# Patient Record
Sex: Female | Born: 1965 | Race: Black or African American | Hispanic: No | Marital: Married | Smoking: Never smoker
Health system: Southern US, Community
[De-identification: ages and names within clinical notes are randomized; demographics above are authoritative.]

## PROBLEM LIST (undated history)

## (undated) DIAGNOSIS — I10 Essential (primary) hypertension: Secondary | ICD-10-CM

## (undated) DIAGNOSIS — M109 Gout, unspecified: Secondary | ICD-10-CM

## (undated) DIAGNOSIS — E119 Type 2 diabetes mellitus without complications: Secondary | ICD-10-CM

## (undated) DIAGNOSIS — T7840XA Allergy, unspecified, initial encounter: Secondary | ICD-10-CM

## (undated) DIAGNOSIS — E669 Obesity, unspecified: Secondary | ICD-10-CM

## (undated) HISTORY — DX: Type 2 diabetes mellitus without complications: E11.9

## (undated) HISTORY — DX: Obesity, unspecified: E66.9

## (undated) HISTORY — PX: TUBAL LIGATION: SHX77

## (undated) HISTORY — DX: Allergy, unspecified, initial encounter: T78.40XA

## (undated) HISTORY — DX: Gout, unspecified: M10.9

---

## 2001-07-12 ENCOUNTER — Other Ambulatory Visit: Admission: RE | Admit: 2001-07-12 | Discharge: 2001-07-12 | Payer: Self-pay | Admitting: Obstetrics and Gynecology

## 2001-08-30 ENCOUNTER — Encounter: Payer: Self-pay | Admitting: Obstetrics and Gynecology

## 2001-08-30 ENCOUNTER — Ambulatory Visit (HOSPITAL_COMMUNITY): Admission: RE | Admit: 2001-08-30 | Discharge: 2001-08-30 | Payer: Self-pay | Admitting: Obstetrics and Gynecology

## 2001-09-08 ENCOUNTER — Ambulatory Visit (HOSPITAL_COMMUNITY): Admission: RE | Admit: 2001-09-08 | Discharge: 2001-09-08 | Payer: Self-pay | Admitting: Obstetrics and Gynecology

## 2001-09-08 ENCOUNTER — Encounter: Payer: Self-pay | Admitting: Obstetrics and Gynecology

## 2005-01-20 ENCOUNTER — Inpatient Hospital Stay (HOSPITAL_COMMUNITY): Admission: AD | Admit: 2005-01-20 | Discharge: 2005-01-20 | Payer: Self-pay | Admitting: Obstetrics and Gynecology

## 2005-01-23 ENCOUNTER — Inpatient Hospital Stay (HOSPITAL_COMMUNITY): Admission: AD | Admit: 2005-01-23 | Discharge: 2005-01-23 | Payer: Self-pay | Admitting: Obstetrics and Gynecology

## 2005-01-26 ENCOUNTER — Inpatient Hospital Stay (HOSPITAL_COMMUNITY): Admission: AD | Admit: 2005-01-26 | Discharge: 2005-01-26 | Payer: Self-pay | Admitting: Obstetrics and Gynecology

## 2005-02-02 ENCOUNTER — Inpatient Hospital Stay (HOSPITAL_COMMUNITY): Admission: AD | Admit: 2005-02-02 | Discharge: 2005-02-02 | Payer: Self-pay | Admitting: Obstetrics and Gynecology

## 2005-02-04 ENCOUNTER — Inpatient Hospital Stay (HOSPITAL_COMMUNITY): Admission: AD | Admit: 2005-02-04 | Discharge: 2005-02-06 | Payer: Self-pay | Admitting: Obstetrics and Gynecology

## 2005-03-17 ENCOUNTER — Other Ambulatory Visit: Admission: RE | Admit: 2005-03-17 | Discharge: 2005-03-17 | Payer: Self-pay | Admitting: Obstetrics and Gynecology

## 2005-07-05 ENCOUNTER — Ambulatory Visit (HOSPITAL_COMMUNITY): Admission: RE | Admit: 2005-07-05 | Discharge: 2005-07-05 | Payer: Self-pay | Admitting: Obstetrics and Gynecology

## 2006-01-11 HISTORY — PX: TUBAL LIGATION: SHX77

## 2012-09-08 ENCOUNTER — Telehealth: Payer: Self-pay | Admitting: *Deleted

## 2012-09-08 ENCOUNTER — Ambulatory Visit (INDEPENDENT_AMBULATORY_CARE_PROVIDER_SITE_OTHER): Payer: BC Managed Care – PPO | Admitting: Emergency Medicine

## 2012-09-08 VITALS — BP 154/110 | HR 110 | Temp 99.2°F | Resp 17 | Ht 60.5 in | Wt 274.0 lb

## 2012-09-08 DIAGNOSIS — I10 Essential (primary) hypertension: Secondary | ICD-10-CM

## 2012-09-08 DIAGNOSIS — G4710 Hypersomnia, unspecified: Secondary | ICD-10-CM

## 2012-09-08 DIAGNOSIS — R0683 Snoring: Secondary | ICD-10-CM

## 2012-09-08 DIAGNOSIS — R040 Epistaxis: Secondary | ICD-10-CM

## 2012-09-08 DIAGNOSIS — E669 Obesity, unspecified: Secondary | ICD-10-CM

## 2012-09-08 DIAGNOSIS — Z1231 Encounter for screening mammogram for malignant neoplasm of breast: Secondary | ICD-10-CM

## 2012-09-08 DIAGNOSIS — G4733 Obstructive sleep apnea (adult) (pediatric): Secondary | ICD-10-CM

## 2012-09-08 LAB — POCT UA - MICROSCOPIC ONLY
Casts, Ur, LPF, POC: NEGATIVE
Mucus, UA: NEGATIVE
WBC, Ur, HPF, POC: NEGATIVE
Yeast, UA: NEGATIVE

## 2012-09-08 LAB — COMPREHENSIVE METABOLIC PANEL
ALT: 17 U/L (ref 0–35)
AST: 18 U/L (ref 0–37)
Albumin: 4 g/dL (ref 3.5–5.2)
Alkaline Phosphatase: 86 U/L (ref 39–117)
Calcium: 9.8 mg/dL (ref 8.4–10.5)
Chloride: 102 mEq/L (ref 96–112)
Potassium: 4.1 mEq/L (ref 3.5–5.3)

## 2012-09-08 LAB — POCT CBC
Granulocyte percent: 56.4 %G (ref 37–80)
HCT, POC: 39.1 % (ref 37.7–47.9)
Hemoglobin: 12 g/dL — AB (ref 12.2–16.2)
Lymph, poc: 2.5 (ref 0.6–3.4)
MCH, POC: 26.4 pg — AB (ref 27–31.2)
MCHC: 30.7 g/dL — AB (ref 31.8–35.4)
MCV: 86.2 fL (ref 80–97)
MID (cbc): 0.4 (ref 0–0.9)
MPV: 8.8 fL (ref 0–99.8)
POC Granulocyte: 3.7 (ref 2–6.9)
POC LYMPH PERCENT: 17.2 %L (ref 10–50)
POC MID %: 6.4 %M (ref 0–12)
Platelet Count, POC: 180 10*3/uL (ref 142–424)
RBC: 4.54 M/uL (ref 4.04–5.48)
RDW, POC: 16.6 %
WBC: 6.6 10*3/uL (ref 4.6–10.2)

## 2012-09-08 LAB — POCT URINALYSIS DIPSTICK
Bilirubin, UA: NEGATIVE
Glucose, UA: NEGATIVE
Leukocytes, UA: NEGATIVE
Nitrite, UA: NEGATIVE
Urobilinogen, UA: 0.2

## 2012-09-08 LAB — TSH: TSH: 1.229 u[IU]/mL (ref 0.350–4.500)

## 2012-09-08 LAB — LIPID PANEL
HDL: 47 mg/dL (ref >39)
LDL Cholesterol: 177 mg/dL — ABNORMAL HIGH (ref 0–99)
Total CHOL/HDL Ratio: 5 Ratio

## 2012-09-08 MED ORDER — LISINOPRIL 20 MG PO TABS: 20.0000 mg | ORAL_TABLET | Freq: Every day | ORAL | Status: DC

## 2012-09-08 NOTE — Progress Notes (Signed)
[facility] [address], [place] [address] [phone]  Date:  09/08/2012   Name:  Victoria Hansen   DOB:  1965-05-13   MRN:  [id number]  PCP:  Abbe Amsterdam, MD    Chief Complaint: Epistaxis   History of Present Illness:  Victoria Hansen is a 47 y.o. very pleasant female patient who presents with the following:  Spontaneous nose bleeds twice in past two days.  Stopped in less than 10 minutes.  No history of injury or illness, wiping or blowing nose.  No medical care in years.  No bleeding or bruising tendency.  No improvement with over the counter medications or other home remedies. Denies other complaint or health concern today.    There are no active problems to display for this patient.   No past medical history on file.  No past surgical history on file.  History  Substance Use Topics  . Smoking status: Never Smoker   . Smokeless tobacco: Not on file  . Alcohol Use: Not on file    No family history on file.  No Known Allergies  Medication list has been reviewed and updated.  No current outpatient prescriptions on file prior to visit.   No current facility-administered medications on file prior to visit.    Review of Systems:  As per HPI, otherwise negative.    Physical Examination: Filed Vitals:   09/08/12 0817  BP: 154/110  Pulse: 110  Temp: 99.2 F (37.3 C)  Resp: 17   Filed Vitals:   09/08/12 0817  Height: 5' 0.5" (1.537 m)  Weight: 274 lb (124.286 kg)   Body mass index is 52.61 kg/(m^2). Ideal Body Weight: Weight in (lb) to have BMI = 25: 129.9  GEN: morbid obesity, NAD, Non-toxic, A & O x 3 HEENT: Atraumatic, Normocephalic. Neck supple. No masses, No LAD. Ears and Nose: No external deformity.  No evidence nosebleed. CV: RRR, No M/G/R. No JVD. No thrill. No extra heart sounds. PULM: CTA B, no wheezes, crackles, rhonchi. No retractions. No resp. distress. No accessory muscle use. ABD: S, NT, ND, +BS. No  rebound. No HSM. EXTR: No c/c/e NEURO Normal gait.  PSYCH: Normally interactive. Conversant. Not depressed or anxious appearing.  Calm demeanor.    Assessment and Plan: OSA Hypertension Sleep study Lisinopril Follow up in two weeks  To 104  Signed,  Phillips Odor, MD

## 2012-09-08 NOTE — Addendum Note (Signed)
Addended by: Cydney Ok on: 09/08/2012 11:26 AM   Modules accepted: Orders

## 2012-09-08 NOTE — Telephone Encounter (Signed)
refers patient for attended sleep study. Patient was referred by Phillips Odor, MD@ [facility]: Patient was seen for nosebleed on 8-29. Dr. Dareen Piano would like a sleep study and manage CPAP if indicated   Height:5' 0.5" (1.537 m)  Weight:274 lb (124.286 kg)  BMI:52.61 kg/m2   Past Medical History:Essential hypertension, benign, Morbid obesity, Obstructive sleep apnea.   Sleep Symptoms: Patient was called and she indicated that she has habitual snoring and excessive daytime sleepiness.  Epworth Score:11  Medications:lisinopril (PRINIVIL,ZESTRIL) 20 MG tablet 90 tablet 0 09/08/2012 Take 1 tablet (20 mg total) by mouth daily.   Insurance:BCBS/patient has a $70.00 co-pay and coverage is 100%  Please review patient information and submit instructions for scheduling and orders for sleep technologist.  Thank you!      I called and left a message for the patient to callback to the office to schedule her sleep study.

## 2012-09-08 NOTE — Patient Instructions (Addendum)

## 2012-09-09 ENCOUNTER — Encounter (HOSPITAL_COMMUNITY): Payer: Self-pay

## 2012-09-09 ENCOUNTER — Emergency Department (HOSPITAL_COMMUNITY)
Admission: EM | Admit: 2012-09-09 | Discharge: 2012-09-09 | Disposition: A | Discharge status: Home / Self Care | Payer: BC Managed Care – PPO | Attending: Emergency Medicine | Admitting: Emergency Medicine

## 2012-09-09 DIAGNOSIS — Z79899 Other long term (current) drug therapy: Secondary | ICD-10-CM | POA: Insufficient documentation

## 2012-09-09 DIAGNOSIS — R040 Epistaxis: Secondary | ICD-10-CM | POA: Insufficient documentation

## 2012-09-09 DIAGNOSIS — I10 Essential (primary) hypertension: Secondary | ICD-10-CM | POA: Insufficient documentation

## 2012-09-09 HISTORY — DX: Essential (primary) hypertension: I10

## 2012-09-09 MED ORDER — PHENYLEPHRINE HCL 0.5 % NA SOLN
1.0000 [drp] | Freq: Once | NASAL | Status: AC
  Administered 2012-09-09: 1 [drp] via NASAL
  Filled 2012-09-09: qty 15

## 2012-09-09 NOTE — ED Notes (Addendum)
Patient presents to ED via POV. Pt states that she had a nose bleed Thursday at work and also on Friday morning. Pt went PCP and was diagnosed with hypertension for the first time. PCP placed pt on blood pressure medication. Pt states that she had another nose bleed tonight, that was much heavier than the other two and was not stopping. Bleeding is very minimal at this time. Pt does not c/o of a headache or any pain. A&Ox4.

## 2012-09-11 NOTE — ED Provider Notes (Signed)
CSN: [id number]     Arrival date & time 09/09/12  0205 History   First MD Initiated Contact with Patient 09/09/12 [phone]     Chief Complaint  Patient presents with  . Epistaxis   (Consider location/radiation/quality/duration/timing/severity/associated sxs/prior Treatment) HPI This patient is a very pleasant woman who presents after a couple of hours of right-sided epistaxis. She says she first had right-sided epistaxis about 3 days ago which was nontraumatic. The episode was short lived but recurred the next day. Today's episode lasted the longest period but, had resolved by the time I saw the patient emergency department.  The patient notes that she has high blood pressure and is concerned that her high blood pressure could have caused epistaxis. Her blood pressure is mildly elevated in the emergency department at 152/93.  The patient denies any lightheadedness or symptoms of anemia. She is not anticoagulated. She has no history of epistaxis.  Past Medical History  Diagnosis Date  . Hypertension    Past Surgical History  Procedure Laterality Date  . Tubal ligation     No family history on file. History  Substance Use Topics  . Smoking status: Never Smoker   . Smokeless tobacco: Never Used  . Alcohol Use: No   OB History   Grav Para Term Preterm Abortions TAB SAB Ect Mult Living                 Review of Systems Gen: no weight loss, fevers, chills, night sweats Eyes: no discharge or drainage, no occular pain or visual changes Nose: As per history of present illness, otherwise negative Mouth: no dental pain, no sore throat Neck: no neck pain Lungs: no SOB, cough, wheezing CV: no chest pain, palpitations, dependent edema or orthopnea Abd: no abdominal pain, nausea, vomiting GU: no dysuria or gross hematuria MSK: no myalgias or arthralgias Neuro: no headache, no focal neurologic deficits Skin: no rash Psyche: negative.  Allergies  Review of patient's allergies indicates  no known allergies.  Home Medications   Current Outpatient Rx  Name  Route  Sig  Dispense  Refill  . lisinopril (PRINIVIL,ZESTRIL) 20 MG tablet   Oral   Take 1 tablet (20 mg total) by mouth daily.   90 tablet   0    BP 160/92  Pulse 90  Temp(Src) 98.1 F (36.7 C) (Oral)  Resp 16  Ht 5\' 1"  (1.549 m)  Wt 174 lb (78.926 kg)  BMI 32.89 kg/m2  SpO2 99%  LMP 07/19/2012 Physical Exam Gen: well developed and well nourished appearing Head: NCAT Eyes: PERL, EOMI Nose: clotted blood in the right nare, no active bleeding. Re-examined after patient expelled clots. There appears to be inflammed mucosa over the region of Hesselbach's plexus,no active bleeding. Unable to rule out posterior bleed. No active bleeding at time of exam.  Mouth/throat: mucosa is moist and pink Neck: supple, no stridor Lungs: CTA B, no wheezing, rhonchi or rales CV: RRR, no murmur Abd: soft, notender, nondistended, obese Back: normal to inspection Skin: normal color, warm Neuro: CN ii-xii grossly intact, no focal deficits Psyche; normal affect,  calm and cooperative.   ED Course  Procedures (including critical care time)  Patient expelled large clot from right nostril and then inhaled some neosynephrine to help with exam. Findings as noted above. Examined with nasal speculum.  MDM  Patient with spontaneous but self limited epistaxis - unable to disceren anterior from posterior bleeding in this case but, suspect anterior. Patient observed for a couple of  hours in the ED with no recurrence. Advised to use Neosynephrine tid x next 3 days and stop. Given referral to local ENT to see if sx recur.   1. Epistaxis   2. Hypertension        Brandt Loosen, MD 09/11/12 [phone]

## 2012-09-18 ENCOUNTER — Other Ambulatory Visit: Payer: Self-pay | Admitting: Neurology

## 2012-09-18 DIAGNOSIS — G4710 Hypersomnia, unspecified: Secondary | ICD-10-CM

## 2012-09-18 DIAGNOSIS — G4730 Sleep apnea, unspecified: Secondary | ICD-10-CM

## 2012-09-18 DIAGNOSIS — E66813 Obesity, class 3: Secondary | ICD-10-CM

## 2012-09-18 DIAGNOSIS — R0683 Snoring: Secondary | ICD-10-CM

## 2012-09-30 ENCOUNTER — Telehealth: Payer: Self-pay

## 2012-09-30 NOTE — Telephone Encounter (Signed)
Patient called wanting to know the location of the office where [facility] has referred her to for a procedure. Phone # [phone].

## 2012-10-01 NOTE — Telephone Encounter (Signed)
LM with address for [facility]-

## 2012-10-02 ENCOUNTER — Ambulatory Visit
Admission: RE | Admit: 2012-10-02 | Discharge: 2012-10-02 | Disposition: A | Discharge status: Home / Self Care | Payer: BC Managed Care – PPO | Source: Ambulatory Visit | Attending: Emergency Medicine | Admitting: Emergency Medicine

## 2012-10-02 DIAGNOSIS — Z1231 Encounter for screening mammogram for malignant neoplasm of breast: Secondary | ICD-10-CM

## 2012-10-09 ENCOUNTER — Telehealth: Payer: Self-pay

## 2012-10-09 NOTE — Telephone Encounter (Signed)
Pt is calling to see who called her earlier  Call back number is [phone]

## 2012-10-09 NOTE — Telephone Encounter (Signed)
Pt was called to tell her that her mammogram was normal. Pt notified.

## 2012-10-10 NOTE — Addendum Note (Signed)
Addended by: Melvyn Novas on: 10/10/2012 01:21 PM   Modules accepted: Orders

## 2012-10-10 NOTE — Sleep Study (Signed)
No sleep study to review

## 2012-11-03 ENCOUNTER — Ambulatory Visit (INDEPENDENT_AMBULATORY_CARE_PROVIDER_SITE_OTHER): Payer: BC Managed Care – PPO

## 2012-11-03 DIAGNOSIS — G4733 Obstructive sleep apnea (adult) (pediatric): Secondary | ICD-10-CM

## 2012-11-03 DIAGNOSIS — R0683 Snoring: Secondary | ICD-10-CM

## 2012-11-03 DIAGNOSIS — G4730 Sleep apnea, unspecified: Secondary | ICD-10-CM

## 2012-11-03 DIAGNOSIS — G4710 Hypersomnia, unspecified: Secondary | ICD-10-CM

## 2012-11-03 DIAGNOSIS — E669 Obesity, unspecified: Secondary | ICD-10-CM

## 2012-11-14 ENCOUNTER — Telehealth: Payer: Self-pay | Admitting: Neurology

## 2012-11-14 ENCOUNTER — Encounter: Payer: Self-pay | Admitting: *Deleted

## 2012-11-14 NOTE — Telephone Encounter (Signed)
I called and spoke with the patient about her recent sleep study. I informed the patient that her sleep study did reveal the diagnosis of severe obstructive sleep apnea and Dr. Vickey Huger would like for her to start CPAP. Patient understood and I will mail a copy to her and fax a copy to Dr. Thornton Papas.

## 2012-11-17 ENCOUNTER — Other Ambulatory Visit: Payer: Self-pay | Admitting: *Deleted

## 2012-11-17 DIAGNOSIS — G4733 Obstructive sleep apnea (adult) (pediatric): Secondary | ICD-10-CM

## 2012-12-08 ENCOUNTER — Ambulatory Visit (INDEPENDENT_AMBULATORY_CARE_PROVIDER_SITE_OTHER): Payer: BC Managed Care – PPO | Admitting: Family Medicine

## 2012-12-08 VITALS — BP 128/72 | HR 80 | Temp 98.5°F | Resp 18 | Ht 60.5 in | Wt 264.6 lb

## 2012-12-08 DIAGNOSIS — I10 Essential (primary) hypertension: Secondary | ICD-10-CM

## 2012-12-08 DIAGNOSIS — Z23 Encounter for immunization: Secondary | ICD-10-CM

## 2012-12-08 MED ORDER — LISINOPRIL 20 MG PO TABS: 20.0000 mg | ORAL_TABLET | Freq: Every day | ORAL | Status: DC

## 2012-12-08 NOTE — Progress Notes (Signed)
[facility] [address] [place] [address] [phone]  Date:  12/08/2012   Name:  Victoria Hansen   DOB:  February 01, 1965   MRN:  [id number]  PCP:  Abbe Amsterdam, MD    Chief Complaint: Medication Management   History of Present Illness:  Victoria Hansen is a 47 y.o. very pleasant female patient who presents with the following:  She is here today to follow-up HTN and lisinopril use.  She notes that her pressure is running around 125/70 at home.  Her nosebleeds have resolved.  She did see ENT for a check and was ok    She has bene on lisinopril for 3 months now and does not note any SI or problems  She is s/p BTL  She is not sure if she would like a flu shot.  She is a Runner, broadcasting/film/video.  We talked about it and she decided she would like this vaccine today  There are no active problems to display for this patient.   Past Medical History  Diagnosis Date  . Hypertension     Past Surgical History  Procedure Laterality Date  . Tubal ligation      History  Substance Use Topics  . Smoking status: Never Smoker   . Smokeless tobacco: Never Used  . Alcohol Use: No    Family History  Problem Relation Age of Onset  . Hypertension Mother   . Thyroid disease Mother   . Heart disease Father   . Hypertension Father     No Known Allergies  Medication list has been reviewed and updated.  Current Outpatient Prescriptions on File Prior to Visit  Medication Sig Dispense Refill  . lisinopril (PRINIVIL,ZESTRIL) 20 MG tablet Take 1 tablet (20 mg total) by mouth daily.  90 tablet  0   No current facility-administered medications on file prior to visit.    Review of Systems:  As per HPI- otherwise negative.   Physical Examination: Filed Vitals:   12/08/12 1630  BP: 128/72  Pulse: 80  Temp: 98.5 F (36.9 C)  Resp: 18   Filed Vitals:   12/08/12 1630  Height: 5' 0.5" (1.537 m)  Weight: 264 lb 9.6 oz (120.022 kg)   Body mass index is 50.81  kg/(m^2). Ideal Body Weight: Weight in (lb) to have BMI = 25: 129.9  GEN: WDWN, NAD, Non-toxic, A & O x 3, obese HEENT: Atraumatic, Normocephalic. Neck supple. No masses, No LAD. Ears and Nose: No external deformity. CV: RRR, No M/G/R. No JVD. No thrill. No extra heart sounds. PULM: CTA B, no wheezes, crackles, rhonchi. No retractions. No resp. distress. No accessory muscle use. EXTR: No c/c/e NEURO Normal gait.  PSYCH: Normally interactive. Conversant. Not depressed or anxious appearing.  Calm demeanor.    Assessment and Plan: HTN (hypertension) - Plan: lisinopril (PRINIVIL,ZESTRIL) 20 MG tablet, Basic metabolic panel  Flu vaccine need - Plan: Flu Vaccine QUAD 36+ mos IM  HTN controlled.  Refill meds, await BMP Flu shot today  Signed Abbe Amsterdam, MD

## 2012-12-09 LAB — BASIC METABOLIC PANEL
CO2: 27 mEq/L (ref 19–32)
Calcium: 9.7 mg/dL (ref 8.4–10.5)
Creat: 0.85 mg/dL (ref 0.50–1.10)
Glucose, Bld: 93 mg/dL (ref 70–99)

## 2012-12-10 ENCOUNTER — Encounter: Payer: Self-pay | Admitting: Family Medicine

## 2013-09-18 ENCOUNTER — Other Ambulatory Visit: Payer: Self-pay | Admitting: Family Medicine

## 2013-09-23 IMAGING — MG MM SCREEN MAMMOGRAM BILATERAL
8 of 12 series · 8 of 12 positions shown · non-contrast
Comparison: None.

CLINICAL DATA: Screening.

DIGITAL SCREENING BILATERAL MAMMOGRAM WITH CAD

[R CC (1 of 3)]
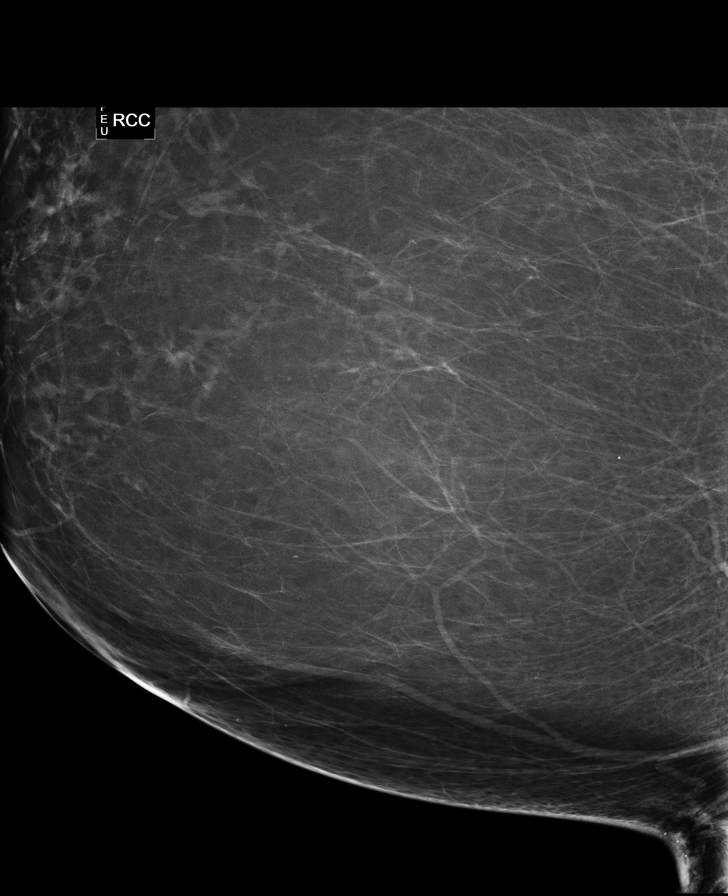

[L CC]
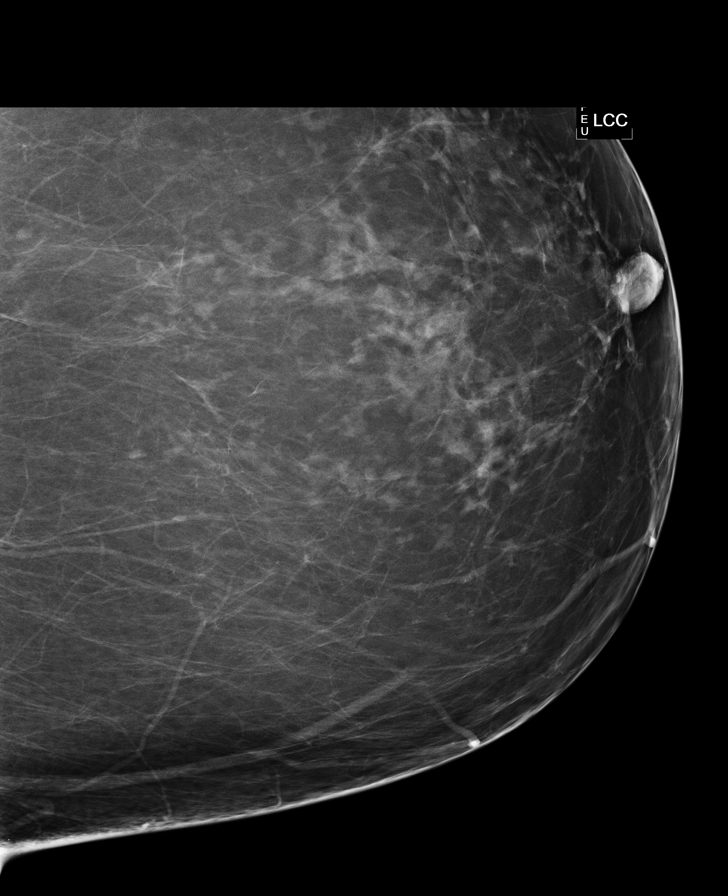

[L MLO]
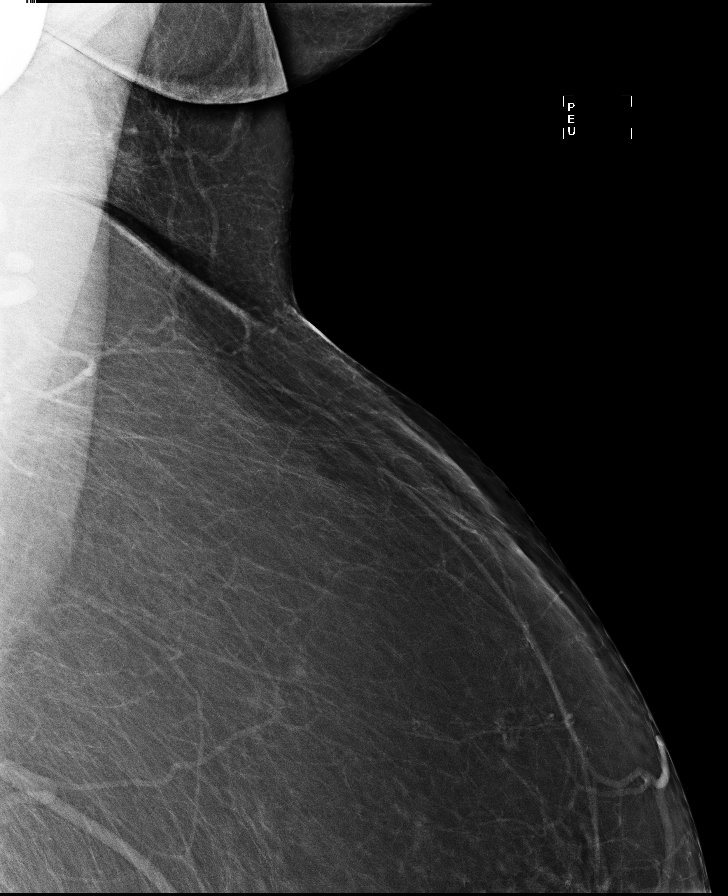

[R MLO (1 of 3)]
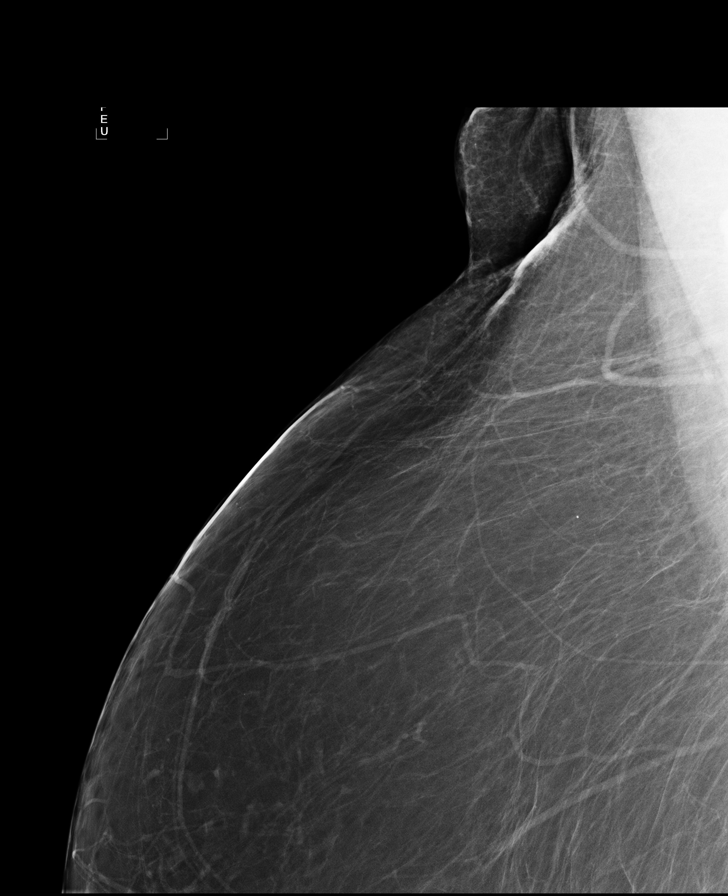

[R CC (2 of 3)]
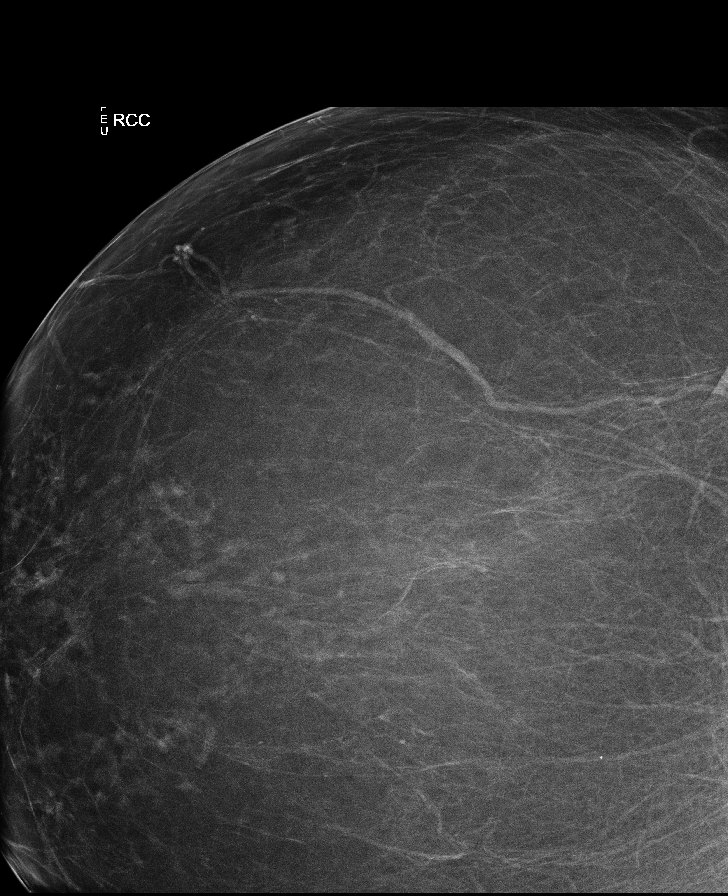

[R CC (3 of 3)]
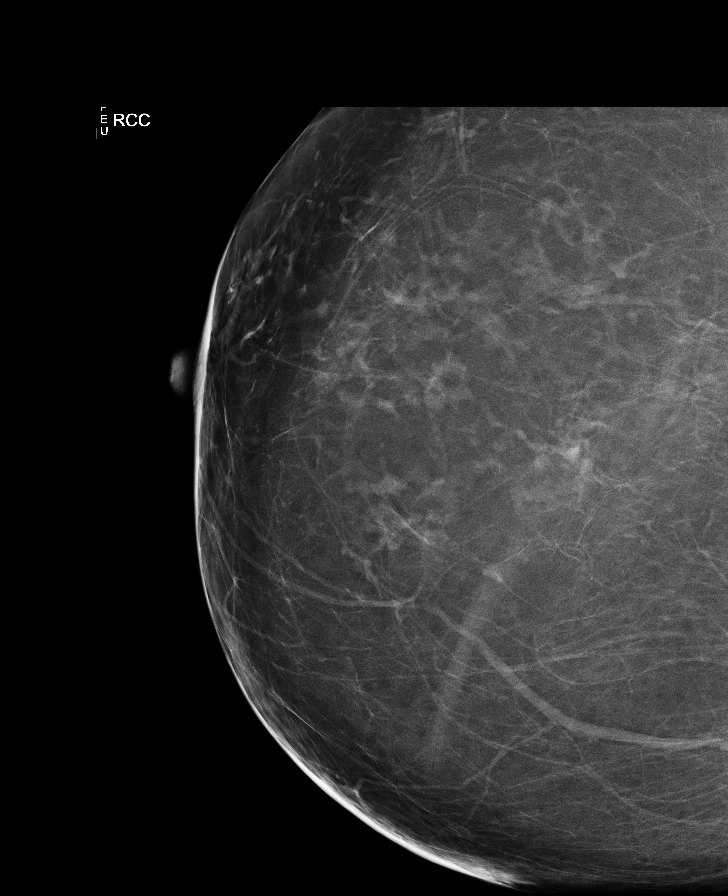

[R MLO (2 of 3)]
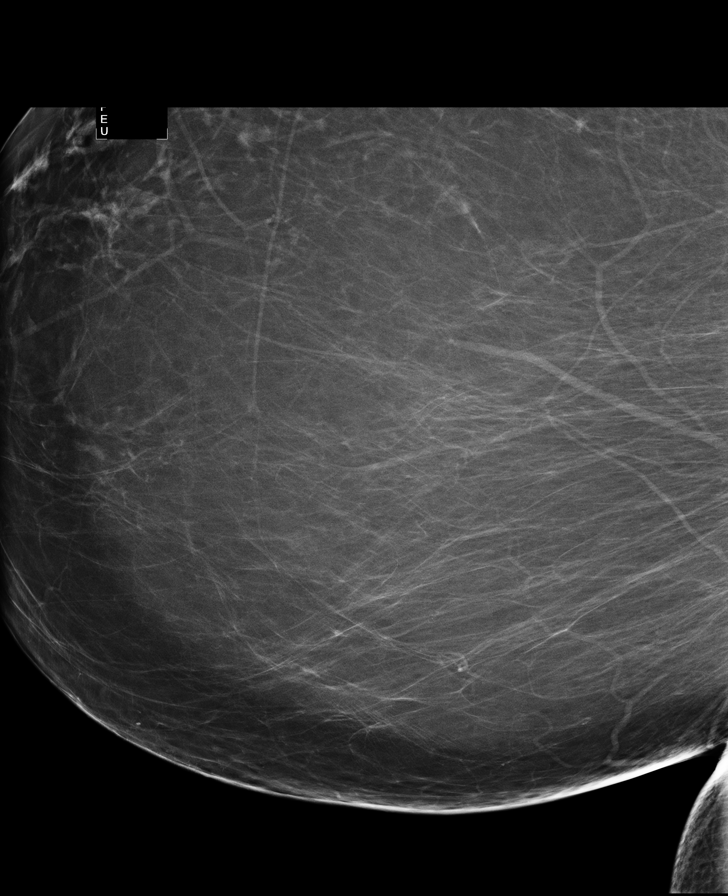

[R MLO (3 of 3)]
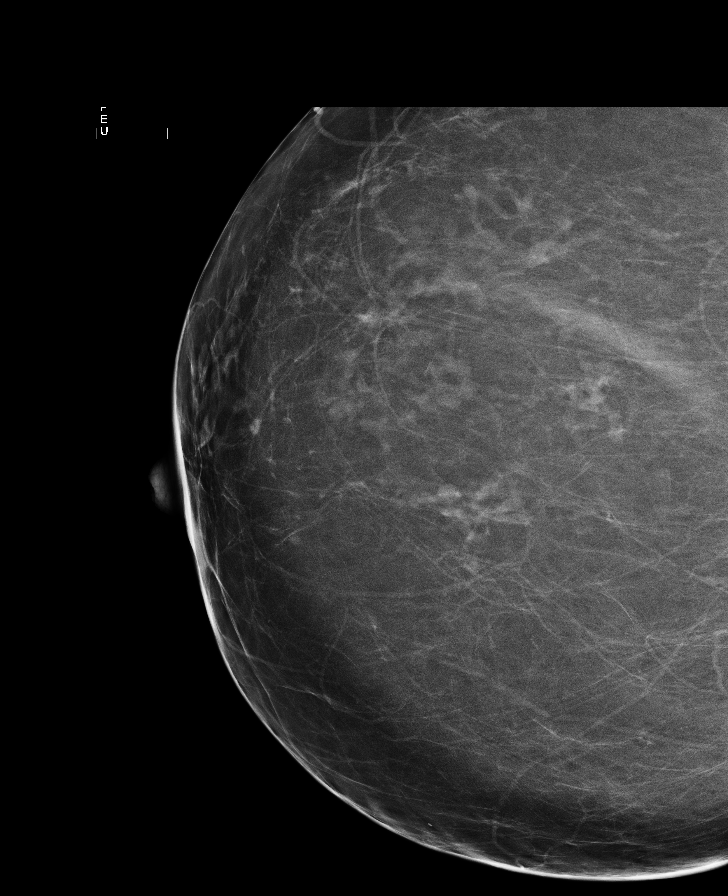

[8 of 12 positions shown; findings below may reference images not displayed]

FINDINGS: ACR Breast Density Category a:  The breast tissue is almost
entirely fatty.

There are no findings suspicious for malignancy.

Images were processed with CAD.
IMPRESSION: No mammographic evidence of malignancy.

A result letter of this screening mammogram will be mailed directly
to the patient.

RECOMMENDATION:
Screening mammogram in one year. (Code:IL-Z-3CG)

BI-RADS CATEGORY 1:  Negative.

## 2013-10-26 ENCOUNTER — Ambulatory Visit (INDEPENDENT_AMBULATORY_CARE_PROVIDER_SITE_OTHER): Payer: BC Managed Care – PPO | Admitting: Family Medicine

## 2013-10-26 VITALS — BP 134/88 | HR 89 | Temp 98.7°F | Resp 18 | Ht 60.0 in | Wt 271.0 lb

## 2013-10-26 DIAGNOSIS — I10 Essential (primary) hypertension: Secondary | ICD-10-CM

## 2013-10-26 DIAGNOSIS — D6489 Other specified anemias: Secondary | ICD-10-CM

## 2013-10-26 DIAGNOSIS — Z23 Encounter for immunization: Secondary | ICD-10-CM

## 2013-10-26 MED ORDER — LISINOPRIL 20 MG PO TABS: 20.0000 mg | ORAL_TABLET | Freq: Every day | ORAL | Status: DC

## 2013-10-26 NOTE — Patient Instructions (Signed)
Your blood pressure looks ok- continue the same dose of medication.  I will be in touch with your labs.    Wt Readings from Last 3 Encounters:  10/26/13 271 lb (122.925 kg)  12/08/12 264 lb 9.6 oz (120.022 kg)  11/04/12 260 lb (117.935 kg)   Your weight has gone up since last year- please try to work on your diet and exercise routines.  Take care!

## 2013-10-26 NOTE — Progress Notes (Signed)
[facility] [address], [place] [address] [phone]  Date:  10/26/2013   Name:  Victoria ReasonMichele B Elmore   DOB:  11/02/1965   MRN:  [id number]  PCP:  Abbe AmsterdamOPLAND,JESSICA, MD    Chief Complaint: Medication Refill   History of Present Illness:  Victoria Hansen is a 48 y.o. very pleasant female patient who presents with the following:  last seen by myself about a year ago.  She needs a RF of her BP medication.  She does occasionally check her BP and may get 130s/80s.   She is doing ok with her medication.  She is s/p BTL She ate about 5.5 hours ago.    She does have an OB-GYN who manages her pap/ mammogram  Patient Active Problem List   Diagnosis Date Noted  . Morbid obesity 12/08/2012  . Essential hypertension, benign 12/08/2012    Past Medical History  Diagnosis Date  . Hypertension     Past Surgical History  Procedure Laterality Date  . Tubal ligation      History  Substance Use Topics  . Smoking status: Never Smoker   . Smokeless tobacco: Never Used  . Alcohol Use: No    Family History  Problem Relation Age of Onset  . Hypertension Mother   . Thyroid disease Mother   . Heart disease Father   . Hypertension Father     No Known Allergies  Medication list has been reviewed and updated.  Current Outpatient Prescriptions on File Prior to Visit  Medication Sig Dispense Refill  . lisinopril (PRINIVIL,ZESTRIL) 20 MG tablet Take 1 tablet (20 mg total) by mouth daily. PATIENT NEEDS OFFICE VISIT FOR ADDITIONAL REFILLS  30 tablet  0   No current facility-administered medications on file prior to visit.    Review of Systems:  As per HPI- otherwise negative.   Physical Examination: Filed Vitals:   10/26/13 1709  BP: 134/88  Pulse: 89  Temp: 98.7 F (37.1 C)  Resp: 18   Filed Vitals:   10/26/13 1709  Height: 5' (1.524 m)  Weight: 271 lb (122.925 kg)   Body mass index is 52.93 kg/(m^2). Ideal Body Weight: Weight in (lb) to  have BMI = 25: 127.7  GEN: WDWN, NAD, Non-toxic, A & O x 3, obese, looks well HEENT: Atraumatic, Normocephalic. Neck supple. No masses, No LAD. Ears and Nose: No external deformity. CV: RRR, No M/G/R. No JVD. No thrill. No extra heart sounds. PULM: CTA B, no wheezes, crackles, rhonchi. No retractions. No resp. distress. No accessory muscle use. EXTR: No c/c/e NEURO Normal gait.  PSYCH: Normally interactive. Conversant. Not depressed or anxious appearing.  Calm demeanor.    Assessment and Plan: Essential hypertension - Plan: lisinopril (PRINIVIL,ZESTRIL) 20 MG tablet, CBC, Comprehensive metabolic panel, Lipid panel  Morbid obesity  Flu shot, labs (she is NOT fasting) and refill of her BP medication today.  See patient instructions for more details.     Signed Abbe AmsterdamJessica Copland, MD

## 2013-10-27 LAB — COMPREHENSIVE METABOLIC PANEL
ALBUMIN: 3.7 g/dL (ref 3.5–5.2)
ALK PHOS: 78 U/L (ref 39–117)
ALT: 16 U/L (ref 0–35)
AST: 17 U/L (ref 0–37)
BUN: 15 mg/dL (ref 6–23)
CO2: 26 mEq/L (ref 19–32)
Calcium: 9.7 mg/dL (ref 8.4–10.5)
Chloride: 104 mEq/L (ref 96–112)
Creat: 0.8 mg/dL (ref 0.50–1.10)
Glucose, Bld: 86 mg/dL (ref 70–99)
Potassium: 4.1 mEq/L (ref 3.5–5.3)
Sodium: 138 mEq/L (ref 135–145)
TOTAL PROTEIN: 7.5 g/dL (ref 6.0–8.3)
Total Bilirubin: 0.3 mg/dL (ref 0.2–1.2)

## 2013-10-27 LAB — LIPID PANEL
Cholesterol: 204 mg/dL — ABNORMAL HIGH (ref 0–200)
HDL: 42 mg/dL (ref >39)
LDL Cholesterol: 146 mg/dL — ABNORMAL HIGH (ref 0–99)
Total CHOL/HDL Ratio: 4.9 Ratio
Triglycerides: 80 mg/dL (ref <150)
VLDL: 16 mg/dL (ref 0–40)

## 2013-10-27 LAB — CBC
HCT: 34.7 % — ABNORMAL LOW (ref 36.0–46.0)
Hemoglobin: 11.2 g/dL — ABNORMAL LOW (ref 12.0–15.0)
MCH: 26.2 pg (ref 26.0–34.0)
MCHC: 32.3 g/dL (ref 30.0–36.0)
MCV: 81.1 fL (ref 78.0–100.0)
PLATELETS: 408 10*3/uL — AB (ref 150–400)
RBC: 4.28 MIL/uL (ref 3.87–5.11)
RDW: 15.7 % — AB (ref 11.5–15.5)
WBC: 6.8 10*3/uL (ref 4.0–10.5)

## 2013-10-28 ENCOUNTER — Encounter: Payer: Self-pay | Admitting: Family Medicine

## 2013-10-28 NOTE — Addendum Note (Signed)
Addended by: Abbe AmsterdamOPLAND, Gaylan Fauver C on: 10/28/2013 04:05 PM   Modules accepted: Orders

## 2014-11-26 ENCOUNTER — Other Ambulatory Visit: Payer: Self-pay | Admitting: Family Medicine

## 2014-12-14 ENCOUNTER — Ambulatory Visit (INDEPENDENT_AMBULATORY_CARE_PROVIDER_SITE_OTHER): Payer: BC Managed Care – PPO | Admitting: Family Medicine

## 2014-12-14 VITALS — BP 138/85 | HR 80 | Temp 98.5°F | Resp 14 | Ht 61.0 in | Wt 275.2 lb

## 2014-12-14 DIAGNOSIS — Z131 Encounter for screening for diabetes mellitus: Secondary | ICD-10-CM | POA: Diagnosis not present

## 2014-12-14 DIAGNOSIS — Z23 Encounter for immunization: Secondary | ICD-10-CM | POA: Diagnosis not present

## 2014-12-14 DIAGNOSIS — I10 Essential (primary) hypertension: Secondary | ICD-10-CM

## 2014-12-14 DIAGNOSIS — Z13220 Encounter for screening for lipoid disorders: Secondary | ICD-10-CM | POA: Diagnosis not present

## 2014-12-14 DIAGNOSIS — Z130 Encounter for screening for diseases of the blood and blood-forming organs and certain disorders involving the immune mechanism: Secondary | ICD-10-CM

## 2014-12-14 DIAGNOSIS — Z794 Long term (current) use of insulin: Secondary | ICD-10-CM | POA: Diagnosis not present

## 2014-12-14 DIAGNOSIS — E119 Type 2 diabetes mellitus without complications: Secondary | ICD-10-CM

## 2014-12-14 LAB — CBC
HCT: 34.6 % — ABNORMAL LOW (ref 36.0–46.0)
Hemoglobin: 11.5 g/dL — ABNORMAL LOW (ref 12.0–15.0)
MCH: 27.2 pg (ref 26.0–34.0)
MCHC: 33.2 g/dL (ref 30.0–36.0)
MCV: 81.8 fL (ref 78.0–100.0)
MPV: 9.2 fL (ref 8.6–12.4)
Platelets: 402 10*3/uL — ABNORMAL HIGH (ref 150–400)
RBC: 4.23 MIL/uL (ref 3.87–5.11)
RDW: 15.4 % (ref 11.5–15.5)
WBC: 6.2 10*3/uL (ref 4.0–10.5)

## 2014-12-14 LAB — LIPID PANEL
CHOL/HDL RATIO: 4.7 ratio (ref <=5.0)
Cholesterol: 217 mg/dL — ABNORMAL HIGH (ref 125–200)
HDL: 46 mg/dL (ref >=46)
LDL CALC: 155 mg/dL — AB (ref <130)
Triglycerides: 80 mg/dL (ref <150)
VLDL: 16 mg/dL (ref <30)

## 2014-12-14 LAB — COMPREHENSIVE METABOLIC PANEL
ALT: 17 U/L (ref 6–29)
AST: 19 U/L (ref 10–35)
Albumin: 4 g/dL (ref 3.6–5.1)
Alkaline Phosphatase: 94 U/L (ref 33–115)
BUN: 12 mg/dL (ref 7–25)
CHLORIDE: 99 mmol/L (ref 98–110)
CO2: 26 mmol/L (ref 20–31)
Calcium: 9.3 mg/dL (ref 8.6–10.2)
Creat: 0.85 mg/dL (ref 0.50–1.10)
GLUCOSE: 96 mg/dL (ref 65–99)
POTASSIUM: 4 mmol/L (ref 3.5–5.3)
Sodium: 133 mmol/L — ABNORMAL LOW (ref 135–146)
Total Bilirubin: 0.5 mg/dL (ref 0.2–1.2)
Total Protein: 7.9 g/dL (ref 6.1–8.1)

## 2014-12-14 MED ORDER — LISINOPRIL 20 MG PO TABS: ORAL_TABLET | ORAL | Status: DC

## 2014-12-14 NOTE — Patient Instructions (Signed)
It was good to see you today- [place]   I will be in touch with your labs You got your tdap (tetanus booster) and flu shots today Keep an eye on your BP- we would like to see you running no higher than 140/85 on average   If you have not had a pap and complete physical in a while I would encourage you to schedule this.

## 2014-12-14 NOTE — Progress Notes (Addendum)
[facility] [address], [place] [address] [phone]  Date:  12/14/2014   Name:  Victoria ReasonMichele B Majchrzak   DOB:  06-18-1965   MRN:  [id number]  PCP:  Abbe AmsterdamOPLAND,Ollie Delano, MD    Chief Complaint: Follow-up; Medication Refill; and Immunizations   History of Present Illness:  Victoria Hansen is a 49 y.o. very pleasant female patient who presents with the following:  She is here today for a recheck of her labs and BP She would like a flu shot and needs a refill of her BP medication She is taking her lisinopril without any problems She is s/p BTL BP Readings from Last 3 Encounters:  12/14/14 142/88  10/26/13 134/88  12/08/12 128/72   She does check her BP sometimes- her last check was 140/74  She is fasting today for labs Unsure of last tetanus shot but would like to go ahead and do one today  Patient Active Problem List   Diagnosis Date Noted  . Morbid obesity (HCC) 12/08/2012  . Essential hypertension, benign 12/08/2012    Past Medical History  Diagnosis Date  . Hypertension     Past Surgical History  Procedure Laterality Date  . Tubal ligation      Social History  Substance Use Topics  . Smoking status: Never Smoker   . Smokeless tobacco: Never Used  . Alcohol Use: No    Family History  Problem Relation Age of Onset  . Hypertension Mother   . Thyroid disease Mother   . Heart disease Father   . Hypertension Father     No Known Allergies  Medication list has been reviewed and updated.  Current Outpatient Prescriptions on File Prior to Visit  Medication Sig Dispense Refill  . lisinopril (PRINIVIL,ZESTRIL) 20 MG tablet TAKE 1 TABLET BY MOUTH DAILY   "OFFICE VISIT NEEDED FOR REFILLS" 30 tablet 0   No current facility-administered medications on file prior to visit.    Review of Systems:  As per HPI- otherwise negative.  Physical Examination: Filed Vitals:   12/14/14 0929  BP: 142/88  Pulse: 80  Temp: 98.5 F (36.9 C)   Resp: 14   Filed Vitals:   12/14/14 0929  Height: 5\' 1"  (1.549 m)  Weight: 275 lb 3.2 oz (124.83 kg)   Body mass index is 52.03 kg/(m^2). Ideal Body Weight: Weight in (lb) to have BMI = 25: 132  GEN: WDWN, NAD, Non-toxic, A & O x 3, obese, looks well HEENT: Atraumatic, Normocephalic. Neck supple. No masses, No LAD. Ears and Nose: No external deformity. CV: RRR, No M/G/R. No JVD. No thrill. No extra heart sounds. PULM: CTA B, no wheezes, crackles, rhonchi. No retractions. No resp. distress. No accessory muscle use. EXTR: No c/c/e NEURO Normal gait.  PSYCH: Normally interactive. Conversant. Not depressed or anxious appearing.  Calm demeanor.    Assessment and Plan: Essential hypertension - Plan: Comprehensive metabolic panel, lisinopril (PRINIVIL,ZESTRIL) 20 MG tablet  Need for immunization against influenza - Plan: Flu Vaccine QUAD 36+ mos IM  Screening for hyperlipidemia - Plan: Lipid panel  Screening for diabetes mellitus - Plan: Hemoglobin A1c  Screening for deficiency anemia - Plan: CBC  Immunization due - Plan: Tdap vaccine greater than or equal to 7yo IM  Check labs and refill mediation today Will plan further follow- up pending labs. Updated immunizations She sees her OBG for paps, etc  Signed Abbe AmsterdamJessica Reynald Woods, MD  [facility].  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  I  am going to send over an rx for metformin- take this once a day for one week, then go to BID.  Let's plan to recheck in about 2 months   Results for orders placed or performed in visit on 12/14/14  CBC  Result Value Ref Range   WBC 6.2 4.0 - 10.5 K/uL   RBC 4.23 3.87 - 5.11 MIL/uL   Hemoglobin 11.5 (L) 12.0 - 15.0 g/dL   HCT 16.1 (L) 09.6 - 04.5 %   MCV 81.8 78.0 - 100.0 fL   MCH 27.2 26.0 - 34.0 pg   MCHC 33.2 30.0 - 36.0 g/dL   RDW 40.9 81.1 - 91.4 %   Platelets 402 (H) 150 - 400 K/uL   MPV 9.2 8.6 - 12.4 fL  Comprehensive metabolic panel  Result Value Ref Range   Sodium 133 (L) 135 - 146 mmol/L    Potassium 4.0 3.5 - 5.3 mmol/L   Chloride 99 98 - 110 mmol/L   CO2 26 20 - 31 mmol/L   Glucose, Bld 96 65 - 99 mg/dL   BUN 12 7 - 25 mg/dL   Creat 7.82 9.56 - 2.13 mg/dL   Total Bilirubin 0.5 0.2 - 1.2 mg/dL   Alkaline Phosphatase 94 33 - 115 U/L   AST 19 10 - 35 U/L   ALT 17 6 - 29 U/L   Total Protein 7.9 6.1 - 8.1 g/dL   Albumin 4.0 3.6 - 5.1 g/dL   Calcium 9.3 8.6 - 08.6 mg/dL  Lipid panel  Result Value Ref Range   Cholesterol 217 (H) 125 - 200 mg/dL   Triglycerides 80 <578 mg/dL   HDL 46 >=46 mg/dL   Total CHOL/HDL Ratio 4.7 <=5.0 Ratio   VLDL 16 <30 mg/dL   LDL Cholesterol 962 (H) <130 mg/dL  Hemoglobin X5M  Result Value Ref Range   Hgb A1c MFr Bld 6.9 (H) <5.7 %   Mean Plasma Glucose 151 (H) <117 mg/dL

## 2014-12-15 ENCOUNTER — Encounter: Payer: Self-pay | Admitting: Family Medicine

## 2014-12-15 DIAGNOSIS — E119 Type 2 diabetes mellitus without complications: Secondary | ICD-10-CM | POA: Insufficient documentation

## 2014-12-15 DIAGNOSIS — E118 Type 2 diabetes mellitus with unspecified complications: Secondary | ICD-10-CM | POA: Insufficient documentation

## 2014-12-15 LAB — HEMOGLOBIN A1C
HEMOGLOBIN A1C: 6.9 % — AB (ref <5.7)
MEAN PLASMA GLUCOSE: 151 mg/dL — AB (ref <117)

## 2014-12-15 MED ORDER — METFORMIN HCL 500 MG PO TABS: 500.0000 mg | ORAL_TABLET | Freq: Two times a day (BID) | ORAL | Status: DC

## 2014-12-15 NOTE — Addendum Note (Signed)
Addended by: Abbe AmsterdamOPLAND, JESSICA C on: 12/15/2014 02:38 PM   Modules accepted: Orders

## 2015-01-31 ENCOUNTER — Encounter: Payer: Self-pay | Admitting: Family Medicine

## 2015-02-05 ENCOUNTER — Encounter: Payer: Self-pay | Admitting: Family Medicine

## 2016-01-08 ENCOUNTER — Other Ambulatory Visit: Payer: Self-pay | Admitting: Emergency Medicine

## 2016-01-08 ENCOUNTER — Other Ambulatory Visit: Payer: Self-pay | Admitting: Family Medicine

## 2016-01-08 DIAGNOSIS — I10 Essential (primary) hypertension: Secondary | ICD-10-CM

## 2016-01-08 MED ORDER — LISINOPRIL 20 MG PO TABS: ORAL_TABLET | ORAL | 0 refills | Status: DC

## 2016-02-25 ENCOUNTER — Encounter: Payer: Self-pay | Admitting: Physician Assistant

## 2016-02-25 ENCOUNTER — Ambulatory Visit (INDEPENDENT_AMBULATORY_CARE_PROVIDER_SITE_OTHER): Payer: BC Managed Care – PPO | Admitting: Physician Assistant

## 2016-02-25 VITALS — BP 126/76 | HR 108 | Temp 99.6°F | Resp 16 | Ht 60.0 in | Wt 273.0 lb

## 2016-02-25 DIAGNOSIS — I10 Essential (primary) hypertension: Secondary | ICD-10-CM

## 2016-02-25 DIAGNOSIS — E119 Type 2 diabetes mellitus without complications: Secondary | ICD-10-CM

## 2016-02-25 DIAGNOSIS — J029 Acute pharyngitis, unspecified: Secondary | ICD-10-CM

## 2016-02-25 DIAGNOSIS — R509 Fever, unspecified: Secondary | ICD-10-CM | POA: Diagnosis not present

## 2016-02-25 DIAGNOSIS — R05 Cough: Secondary | ICD-10-CM

## 2016-02-25 DIAGNOSIS — R059 Cough, unspecified: Secondary | ICD-10-CM

## 2016-02-25 DIAGNOSIS — J020 Streptococcal pharyngitis: Secondary | ICD-10-CM

## 2016-02-25 LAB — POCT INFLUENZA A/B
INFLUENZA A, POC: NEGATIVE
INFLUENZA B, POC: NEGATIVE

## 2016-02-25 LAB — POCT RAPID STREP A (OFFICE): Rapid Strep A Screen: POSITIVE — AB

## 2016-02-25 MED ORDER — BENZONATATE 100 MG PO CAPS: 100.0000 mg | ORAL_CAPSULE | Freq: Three times a day (TID) | ORAL | 0 refills | Status: DC | PRN

## 2016-02-25 MED ORDER — AMOXICILLIN 500 MG PO CAPS: 500.0000 mg | ORAL_CAPSULE | Freq: Two times a day (BID) | ORAL | 0 refills | Status: AC

## 2016-02-25 NOTE — Patient Instructions (Addendum)
Take antibiotics as prescribed for strep. You can use tessalon perles for the cough. And take tylenol for fever. Drink lots of water and eat soup or soft foods until sore throat resolves. Follow up with me next week for annual physical exam and basic lab work! Thank you for letting me participate in your health and well being.    Strep Throat Strep throat is an infection of the throat. It is caused by germs. Strep throat spreads from person to person because of coughing, sneezing, or close contact. Follow these instructions at home: Medicines  Take over-the-counter and prescription medicines only as told by your doctor.  Take your antibiotic medicine as told by your doctor. Do not stop taking the medicine even if you feel better.  Have family members who also have a sore throat or fever go to a doctor. Eating and drinking  Do not share food, drinking cups, or personal items.  Try eating soft foods until your sore throat feels better.  Drink enough fluid to keep your pee (urine) clear or pale yellow. General instructions  Rinse your mouth (gargle) with a salt-water mixture 3-4 times per day or as needed. To make a salt-water mixture, stir -1 tsp of salt into 1 cup of warm water.  Make sure that all people in your house wash their hands well.  Rest.  Stay home from school or work until you have been taking antibiotics for 24 hours.  Keep all follow-up visits as told by your doctor. This is important. Contact a doctor if:  Your neck keeps getting bigger.  You get a rash, cough, or earache.  You cough up thick liquid that is green, yellow-brown, or bloody.  You have pain that does not get better with medicine.  Your problems get worse instead of getting better.  You have a fever. Get help right away if:  You throw up (vomit).  You get a very bad headache.  You neck hurts or it feels stiff.  You have chest pain or you are short of breath.  You have drooling, very  bad throat pain, or changes in your voice.  Your neck is swollen or the skin gets red and tender.  Your mouth is dry or you are peeing less than normal.  You keep feeling more tired or it is hard to wake up.  Your joints are red or they hurt. This information is not intended to replace advice given to you by your health care provider. Make sure you discuss any questions you have with your health care provider. Document Released: 06/16/2007 Document Revised: 08/27/2015 Document Reviewed: 04/22/2014 Elsevier Interactive Patient Education  2017 [employer]     IF you received an x-ray today, you will receive an invoice from [facility]. Please contact [facility] at [phone] with questions or concerns regarding your invoice.   IF you received labwork today, you will receive an invoice from [facility]. Please contact [facility] at [phone] with questions or concerns regarding your invoice.   Our billing staff will not be able to assist you with questions regarding bills from these companies.  You will be contacted with the lab results as soon as they are available. The fastest way to get your results is to activate your My Chart account. Instructions are located on the last page of this paperwork. If you have not heard from us regarding the results in 2 weeks, please contact this office.

## 2016-02-25 NOTE — Progress Notes (Signed)
MRN: [id number] DOB: 05/02/1965  Subjective:   Victoria Hansen ReasonMichele B Hansen is a 51 y.o. female presenting for chief complaint of Fever (X 2 DAYS); Cough; and Sore Throat .  Reports 2 day history of body aches, sore throat, cough, fever (101), rhinorrhea, and mild chills. Has tried tylenol for relief of fever. Denies  ear pain and shortness of breath, nausea, vomiting, abdominal pain and diarrhea. 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 No history of seasonal allergies or asthma. Patient did not have flu shot this season. Denies smoking and alcohol use. Denies any other aggravating or relieving factors, no other questions or concerns.  Victoria Hansen has a current medication list which includes the following prescription(s): lisinopril and metformin. Also has No Known Allergies.  Victoria Hansen  has a past medical history of Hypertension. Also  has a past surgical history that includes Tubal ligation.   Objective:   Vitals: BP 126/76 (BP Location: Right Arm, Patient Position: Sitting, Cuff Size: Large)   Pulse (!) 108   Temp 99.6 F (37.6 C) (Oral)   Resp 16   Ht 5' (1.524 m)   Wt 273 lb (123.8 kg)   LMP 10/25/2015 (Approximate)   SpO2 97%   BMI 53.32 kg/m   Physical Exam  Constitutional: She is oriented to person, place, and time. She appears well-developed and well-nourished. She appears distressed (mild, sitting on exam table).  HENT:  Head: Normocephalic and atraumatic.  Right Ear: External ear and ear canal normal. A middle ear effusion is present.  Left Ear: Tympanic membrane, external ear and ear canal normal.  Nose: Mucosal edema present. No rhinorrhea. Right sinus exhibits no maxillary sinus tenderness and no frontal sinus tenderness. Left sinus exhibits no maxillary sinus tenderness and no frontal sinus tenderness.  Mouth/Throat: Uvula is midline. Posterior oropharyngeal erythema present. Tonsils are 2+ on the right. Tonsils are  2+ on the left. Tonsillar exudate.  Eyes: Conjunctivae are normal.  Neck: Normal range of motion.  Cardiovascular: Normal rate, regular rhythm and normal heart sounds.   Pulmonary/Chest: Effort normal and breath sounds normal.  Lymphadenopathy:       Head (right side): No submental, no submandibular, no tonsillar, no preauricular, no posterior auricular and no occipital adenopathy present.       Head (left side): No submental, no submandibular, no tonsillar, no preauricular, no posterior auricular and no occipital adenopathy present.    She has no cervical adenopathy.       Right: No supraclavicular adenopathy present.       Left: No supraclavicular adenopathy present.  Neurological: She is alert and oriented to person, place, and time.  Skin: Skin is warm and dry.  Psychiatric: She has a normal mood and affect.  Vitals reviewed.   Results for orders placed or performed in visit on 02/25/16 (from the past 24 hour(s))  POCT rapid strep A     Status: Abnormal   Collection Time: 02/25/16  9:19 AM  Result Value Ref Range   Rapid Strep A Screen Positive (A) Negative  POCT Influenza A/B     Status: None   Collection Time: 02/25/16  9:29 AM  Result Value Ref Range   Influenza A, POC Negative Negative   Influenza B, POC Negative Negative    Assessment and Plan :  1. Sore throat - POCT rapid strep A  2. Cough - POCT Influenza A/B - benzonatate (TESSALON) 100 MG capsule; Take  1-2 capsules (100-200 mg total) by mouth 3 (three) times daily as needed for cough.  Dispense: 40 capsule; Refill: 0  3. Fever, unspecified fever cause - POCT Influenza A/B  4. Strep pharyngitis -Given educational material on strep throat. Pt informed to consume soft food diet and continue oral hydration with water. Use tylenol for fever and pain. Return to clinic if symptoms worsen, do not improve with treatment, or as needed - amoxicillin (AMOXIL) 500 MG capsule; Take 1 capsule (500 mg total) by mouth 2 (two)  times daily.  Dispense: 20 capsule; Refill: 0  5. Type 2 diabetes mellitus without complication, without long-term current use of insulin (HCC) 6. Essential hypertension Of note, pt has not had an annual this year and has chronic diseases such as HTN and T2DM that are not being followed. She stopped taking metformin a while ago as it was bothering her stomach and has not had an A1C since she stopped taking the medication. She has been informed to follow up with me in two weeks for annual physical exam and management of chronic disorders.   Benjiman Core, PA-C  [facility] 02/25/2016 9:46 AM

## 2016-03-03 ENCOUNTER — Ambulatory Visit: Payer: BC Managed Care – PPO | Admitting: Physician Assistant

## 2016-03-10 ENCOUNTER — Encounter: Payer: Self-pay | Admitting: Physician Assistant

## 2016-03-10 ENCOUNTER — Ambulatory Visit (INDEPENDENT_AMBULATORY_CARE_PROVIDER_SITE_OTHER): Payer: BC Managed Care – PPO | Admitting: Physician Assistant

## 2016-03-10 VITALS — BP 150/88 | HR 81 | Temp 98.5°F | Resp 18 | Ht 60.0 in | Wt 270.0 lb

## 2016-03-10 DIAGNOSIS — Z130 Encounter for screening for diseases of the blood and blood-forming organs and certain disorders involving the immune mechanism: Secondary | ICD-10-CM | POA: Diagnosis not present

## 2016-03-10 DIAGNOSIS — Z23 Encounter for immunization: Secondary | ICD-10-CM

## 2016-03-10 DIAGNOSIS — I10 Essential (primary) hypertension: Secondary | ICD-10-CM

## 2016-03-10 DIAGNOSIS — Z8349 Family history of other endocrine, nutritional and metabolic diseases: Secondary | ICD-10-CM

## 2016-03-10 DIAGNOSIS — E119 Type 2 diabetes mellitus without complications: Secondary | ICD-10-CM

## 2016-03-10 DIAGNOSIS — Z1321 Encounter for screening for nutritional disorder: Secondary | ICD-10-CM

## 2016-03-10 DIAGNOSIS — Z0000 Encounter for general adult medical examination without abnormal findings: Secondary | ICD-10-CM

## 2016-03-10 LAB — POCT GLYCOSYLATED HEMOGLOBIN (HGB A1C): Hemoglobin A1C: 6.5

## 2016-03-10 MED ORDER — BLOOD GLUCOSE MONITOR SYSTEM W/DEVICE KIT: 1.0000 [IU] | PACK | Freq: Every day | 0 refills | Status: DC

## 2016-03-10 MED ORDER — METFORMIN HCL ER 500 MG PO TB24: ORAL_TABLET | ORAL | 0 refills | Status: DC

## 2016-03-10 MED ORDER — LISINOPRIL 20 MG PO TABS: ORAL_TABLET | ORAL | 1 refills | Status: DC

## 2016-03-10 NOTE — Progress Notes (Signed)
Victoria Hansen  MRN: [id number] DOB: Jul 31, 1965  Subjective:  Pt is a 51 y.o. female who presents for annual physical exam and management of chronic disorders. She is fasting today.   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 Pt is married with 2 children. Both of her children are adopted. One is 31 yo and the other 50 yo. She is sexually active.   Menstrual cycles: They are becoming for infrequent. Last cycle was 10/2015. Denies any menopausal symptoms like hot flashes, vaginal dryness, and irritability.   Bowel movements: Regular, daily, normal consistency.   Chronic issues:  1) HTN: Dx made in 2014. Currently managed with lisinopril 44m. Patient is checking blood pressure at home, range is 1867Jsystolic.  Denies lightheadedness, dizziness, chronic headache, double vision, chest pain, shortness of breath, heart racing, palpitations, nausea, vomiting, abdominal pain, hematuria, lower leg swelling. Denies smoking and alcohol use.  2) T2DM: Dx made in 2017, her A1C was 6.9. She was put on 5067mBID. She took it for about three months but once she ran out did not return for further refills. She does not check her sugars at home.   Current symptoms include none. Patient denies increased appetite, nausea, paresthesia of the feet, polydipsia, polyuria, visual disturbances, vomiting and weight loss. Patient is not checking their feet daily. Last diabetic eye exam eye exam was 2017. Diet includes baked chicken, fresh and frozen vegetables, and fruits. She loves carbs but is trying to cut backTypically drinks sweet tea or water.   Exercise includes none. Immunizations: Flu vaccine: not this year, pneumococal vaccine: had one but cannot recall when   Last dental exam: A few years ago, cannot recall when  Last pap smear: >3 years ago, normal  Last mammogram: 2016 Vaccinations      Tetanus: 12/14/2014        Patient Active Problem List   Diagnosis Date Noted  .  Diabetes mellitus type 2, controlled, without complications ([facility] and E1244/92/0100. Morbid obesity ([facility]11/28/2014  . Essential hypertension, benign 12/08/2012    Current Outpatient Prescriptions on File Prior to Visit  Medication Sig Dispense Refill  . benzonatate (TESSALON) 100 MG capsule Take 1-2 capsules (100-200 mg total) by mouth 3 (three) times daily as needed for cough. 40 capsule 0   No current facility-administered medications on file prior to visit.     No Known Allergies  Social History   Social History  . Marital status: Married    Spouse name: N/A  . Number of children: N/A  . Years of education: N/A   Social History Main Topics  . Smoking status: Never Smoker  . Smokeless tobacco: Never Used  . Alcohol use No  . Drug use: No  . Sexual activity: Not Asked   Other Topics Concern  . None   Social History Narrative  . None    Past Surgical History:  Procedure Laterality Date  . TUBAL LIGATION      Family History  Problem Relation Age of Onset  . Hypertension Mother   . Thyroid disease Mother   . Heart disease Father   . Hypertension Father     Review of Systems  Constitutional: Negative for activity change, appetite change, chills, diaphoresis, fatigue, fever and unexpected weight change.  HENT: Positive for sinus pressure. Negative for congestion, dental problem, drooling, ear discharge, ear pain, facial swelling, hearing loss, mouth sores, nosebleeds, postnasal drip, rhinorrhea, sinus pain, sneezing, sore throat, tinnitus, trouble  swallowing and voice change.   Eyes: Negative for photophobia, pain, discharge, redness, itching and visual disturbance.  Respiratory: Negative for apnea, cough, choking, chest tightness, shortness of breath, wheezing and stridor.   Cardiovascular: Negative for chest pain, palpitations and leg swelling.  Gastrointestinal: Negative for abdominal distention, abdominal pain, anal bleeding, blood in stool, constipation, diarrhea,  nausea, rectal pain and vomiting.  Endocrine: Negative for cold intolerance, heat intolerance, polydipsia, polyphagia and polyuria.  Genitourinary: Negative for decreased urine volume, difficulty urinating, dyspareunia, dysuria, enuresis, flank pain, frequency, genital sores, hematuria, menstrual problem, pelvic pain, urgency, vaginal bleeding, vaginal discharge and vaginal pain.  Musculoskeletal: Negative for arthralgias, back pain, gait problem, joint swelling, myalgias, neck pain and neck stiffness.  Skin: Negative for color change, pallor, rash and wound.  Allergic/Immunologic: Negative for environmental allergies, food allergies and immunocompromised state.  Neurological: Negative for dizziness, tremors, seizures, syncope, facial asymmetry, speech difficulty, weakness, light-headedness, numbness and headaches.  Hematological: Negative for adenopathy. Does not bruise/bleed easily.  Psychiatric/Behavioral: Negative for agitation, behavioral problems, confusion, decreased concentration, dysphoric mood, hallucinations, self-injury, sleep disturbance and suicidal ideas. The patient is not nervous/anxious and is not hyperactive.     Objective:  BP (!) 150/88   Pulse 81   Temp 98.5 F (36.9 C) (Oral)   Resp 18   Ht 5' (1.524 m)   Wt 270 lb (122.5 kg)   SpO2 96%   BMI 52.73 kg/m   Physical Exam  Constitutional: She is oriented to person, place, and time and well-developed, well-nourished, and in no distress.  HENT:  Head: Normocephalic and atraumatic.  Right Ear: Hearing, tympanic membrane, external ear and ear canal normal.  Left Ear: Hearing, tympanic membrane, external ear and ear canal normal.  Nose: Nose normal.  Mouth/Throat: Uvula is midline, oropharynx is clear and moist and mucous membranes are normal. No oropharyngeal exudate.  Eyes: Conjunctivae, EOM and lids are normal. Pupils are equal, round, and reactive to light. No scleral icterus.  Neck: Trachea normal and normal range  of motion. No thyroid mass and no thyromegaly present.  Cardiovascular: Normal rate, regular rhythm, normal heart sounds and intact distal pulses.   Pulmonary/Chest: Effort normal and breath sounds normal.  Abdominal: Soft. Normal appearance and bowel sounds are normal. There is no tenderness.  Lymphadenopathy:       Head (right side): No tonsillar, no preauricular, no posterior auricular and no occipital adenopathy present.       Head (left side): No tonsillar, no preauricular, no posterior auricular and no occipital adenopathy present.    She has no cervical adenopathy.       Right: No supraclavicular adenopathy present.       Left: No supraclavicular adenopathy present.  Neurological: She is alert and oriented to person, place, and time. She has normal sensation, normal strength and normal reflexes. Gait normal.  Skin: Skin is warm and dry.  Psychiatric: Affect normal.     BP Readings from Last 3 Encounters:  03/10/16 (!) 150/88  02/25/16 126/76  12/14/14 138/85   No exam data present   Results for orders placed or performed in visit on 03/10/16 (from the past 24 hour(s))  POCT glycosylated hemoglobin (Hb A1C)     Status: None   Collection Time: 03/10/16  5:36 PM  Result Value Ref Range   Hemoglobin A1C 6.5    Assessment and Plan :  Discussed healthy lifestyle, diet, exercise, preventative care, vaccinations, and addressed patient's concerns. Plan for follow up in 3 months. Otherwise,  plan for specific conditions below.  1. Annual physical exam Await lab results, pt DECLINES pap smear today.  2. Need for influenza vaccination - Flu Vaccine QUAD 36+ mos IM  3. Screening, anemia, deficiency, iron - CBC with Differential/Platelet  4. Essential hypertension Uncontrolled in office today. Pt is asymptomatic.She has not been home since Saturday and has been really stressed. She has not gotten much sleep and has not eaten today. Instructed to monitor closely at home, goal is  <140/90, informed that if she is consistently over this value, contact our office.  - CMP14+EGFR - lisinopril (PRINIVIL,ZESTRIL) 20 MG tablet; TAKE 1 TABLET BY MOUTH DAILY  Dispense: 90 tablet; Refill: 1  5. Family history of thyroid disorder - TSH  6. Encounter for vitamin deficiency screening - VITAMIN D 25 Hydroxy (Vit-D Deficiency, Fractures)  7. Type 2 diabetes mellitus without complication, without long-term current use of insulin (HCC) -After Rx was sent to pharmacy, POCT A1C returned. Pt informed to titrate up to 581m BID and follow up in 3 months. - Lipid panel - POCT glycosylated hemoglobin (Hb A1C) - metFORMIN (GLUCOPHAGE XR) 500 MG 24 hr tablet; Wk 1:1 tab in am with food. Wk 2:1 tab in am and pm with food.Wk 3: 2 tabs am, 1 tab pm with food.Wk 4: 2 tabs am, 2 tabs pm with food.  Dispense: 360 tablet; Refill: 0 - Blood Glucose Monitoring Suppl (BLOOD GLUCOSE MONITOR SYSTEM) w/Device KIT; 1 Units by Does not apply route daily.  Dispense: 1 each; Refill: 0 - Ambulatory referral to diabetic education  BTenna DelainePA-C  [facility] 03/10/2016 7:55 PM

## 2016-03-10 NOTE — Patient Instructions (Addendum)
Follow up in 3 months for diabetes. Start checking your sugar daily. You will be contacted by diabetic nutrition, they are awesome! We will also want to put you on a statin once I have your lab results so I will call you about this.   For htn, check bp every few days, goal is <140/90. If you are consistently above this, call our office as we may need to add another medication.     Type 2 Diabetes Mellitus, Diagnosis, Adult Type 2 diabetes (type 2 diabetes mellitus) is a long-term (chronic) disease. It may be caused by one or both of these problems:  Your body does not make enough of a hormone called insulin.  Your body does not react in a normal way to insulin that it makes. Insulin lets sugars (glucose) go into cells in the body. This gives you energy. If you have type 2 diabetes, sugars cannot get into cells. This causes high blood sugar (hyperglycemia). Your doctor will set treatment goals for you. Generally, you should have these blood sugar levels:  Before meals (preprandial): 80-130 mg/dL (4.0-9.84.4-7.2 mmol/L).  After meals (postprandial): below 180 mg/dL (10 mmol/L).  A1c (hemoglobin A1c) level: less than 7%. Follow these instructions at home: Questions to Ask Your Doctor   You may want to ask these questions:  Do I need to meet with a diabetes educator?  Where can I find a support group for people with diabetes?  What equipment will I need to care for myself at home?  What diabetes medicines do I need? When should I take them?  How often do I need to check my blood sugar?  What number can I call if I have questions?  When is my next doctor's visit? General instructions   Take over-the-counter and prescription medicines only as told by your doctor.  Keep all follow-up visits as told by your doctor. This is important. Contact a doctor if:  Your blood sugar is at or above 240 mg/dL (11.913.3 mmol/L) for 2 days in a row.  You have been sick or have had a fever for 2 days or  more and you are not getting better.  You have any of these problems for more than 6 hours:  You cannot eat or drink.  You feel sick to your stomach (nauseous).  You throw up (vomit).  You have watery poop (diarrhea). Get help right away if:  Your blood sugar is lower than 54 mg/dL (3 mmol/L).  You get confused.  You have trouble:  Thinking clearly.  Breathing.  You have moderate or large ketone levels in your pee (urine). This information is not intended to replace advice given to you by your health care provider. Make sure you discuss any questions you have with your health care provider. Document Released: 10/07/2007 Document Revised: 06/05/2015 Document Reviewed: 01/31/2015 Elsevier Interactive Patient Education  2017 [employer]   IF you received an x-ray today, you will receive an invoice from [facility]. Please contact [facility] at [phone] with questions or concerns regarding your invoice.   IF you received labwork today, you will receive an invoice from [facility]. Please contact [facility] at [phone] with questions or concerns regarding your invoice.   Our billing staff will not be able to assist you with questions regarding bills from these companies.  You will be contacted with the lab results as soon as they are available. The fastest way to get your results is to activate your My Chart account. Instructions are  located on the last page of this paperwork. If you have not heard from [place] regarding the results in 2 weeks, please contact this office.     '

## 2016-03-11 ENCOUNTER — Other Ambulatory Visit: Payer: Self-pay | Admitting: Physician Assistant

## 2016-03-11 LAB — LIPID PANEL
CHOLESTEROL TOTAL: 224 mg/dL — AB (ref 100–199)
Chol/HDL Ratio: 4.8 ratio units — ABNORMAL HIGH (ref 0.0–4.4)
HDL: 47 mg/dL (ref >39)
LDL Calculated: 163 mg/dL — ABNORMAL HIGH (ref 0–99)
TRIGLYCERIDES: 72 mg/dL (ref 0–149)
VLDL Cholesterol Cal: 14 mg/dL (ref 5–40)

## 2016-03-11 LAB — CBC WITH DIFFERENTIAL/PLATELET
BASOS ABS: 0 10*3/uL (ref 0.0–0.2)
Basos: 1 %
EOS (ABSOLUTE): 0.1 10*3/uL (ref 0.0–0.4)
Eos: 2 %
Hematocrit: 34.1 % (ref 34.0–46.6)
Hemoglobin: 11.2 g/dL (ref 11.1–15.9)
IMMATURE GRANULOCYTES: 0 %
Immature Grans (Abs): 0 10*3/uL (ref 0.0–0.1)
Lymphocytes Absolute: 3.2 10*3/uL — ABNORMAL HIGH (ref 0.7–3.1)
Lymphs: 49 %
MCH: 26.3 pg — ABNORMAL LOW (ref 26.6–33.0)
MCHC: 32.8 g/dL (ref 31.5–35.7)
MCV: 80 fL (ref 79–97)
MONOS ABS: 0.5 10*3/uL (ref 0.1–0.9)
Monocytes: 8 %
NEUTROS PCT: 40 %
Neutrophils Absolute: 2.6 10*3/uL (ref 1.4–7.0)
PLATELETS: 405 10*3/uL — AB (ref 150–379)
RBC: 4.26 x10E6/uL (ref 3.77–5.28)
RDW: 16.3 % — AB (ref 12.3–15.4)
WBC: 6.4 10*3/uL (ref 3.4–10.8)

## 2016-03-11 LAB — CMP14+EGFR
ALK PHOS: 91 IU/L (ref 39–117)
ALT: 19 IU/L (ref 0–32)
AST: 19 IU/L (ref 0–40)
Albumin/Globulin Ratio: 1.1 — ABNORMAL LOW (ref 1.2–2.2)
Albumin: 4.1 g/dL (ref 3.5–5.5)
BUN/Creatinine Ratio: 13 (ref 9–23)
BUN: 11 mg/dL (ref 6–24)
Bilirubin Total: 0.4 mg/dL (ref 0.0–1.2)
CO2: 26 mmol/L (ref 18–29)
CREATININE: 0.87 mg/dL (ref 0.57–1.00)
Calcium: 9.7 mg/dL (ref 8.7–10.2)
Chloride: 98 mmol/L (ref 96–106)
GFR calc Af Amer: 90 mL/min/{1.73_m2} (ref >59)
GFR calc non Af Amer: 78 mL/min/{1.73_m2} (ref >59)
GLOBULIN, TOTAL: 3.9 g/dL (ref 1.5–4.5)
GLUCOSE: 89 mg/dL (ref 65–99)
Potassium: 4.4 mmol/L (ref 3.5–5.2)
SODIUM: 141 mmol/L (ref 134–144)
Total Protein: 8 g/dL (ref 6.0–8.5)

## 2016-03-11 LAB — VITAMIN D 25 HYDROXY (VIT D DEFICIENCY, FRACTURES): Vit D, 25-Hydroxy: 10.3 ng/mL — ABNORMAL LOW (ref 30.0–100.0)

## 2016-03-11 LAB — TSH: TSH: 0.883 u[IU]/mL (ref 0.450–4.500)

## 2016-03-11 MED ORDER — ATORVASTATIN CALCIUM 40 MG PO TABS: 40.0000 mg | ORAL_TABLET | Freq: Every day | ORAL | 3 refills | Status: DC

## 2016-03-11 NOTE — Progress Notes (Signed)
Meds ordered this encounter  Medications  . atorvastatin (LIPITOR) 40 MG tablet    Sig: Take 1 tablet (40 mg total) by mouth daily.    Dispense:  90 tablet    Refill:  3    Order Specific Question:   Supervising Provider    Answer:   SMITH, KRISTI M [[id number]]    

## 2016-03-16 ENCOUNTER — Telehealth: Payer: Self-pay | Admitting: Family Medicine

## 2016-03-16 DIAGNOSIS — E119 Type 2 diabetes mellitus without complications: Secondary | ICD-10-CM

## 2016-03-16 NOTE — Telephone Encounter (Signed)
PT CALLED STATING THAT Victoria Hansen DINDT SEND OVER THE TEST STRIPS FOR THE ACC U CHECK GLUCOSE MONITOR PLEASE SEND TO [facility] ON [address]

## 2016-03-17 MED ORDER — BLOOD GLUCOSE MONITOR SYSTEM W/DEVICE KIT: 1.0000 [IU] | PACK | Freq: Every day | 0 refills | Status: AC

## 2016-03-17 NOTE — Telephone Encounter (Signed)
She did order the glucometer and testing supplies, but she sent [facility] to [facility] ([address] which is what is listed in the patient's chart.  [facility]

## 2016-06-09 ENCOUNTER — Ambulatory Visit: Payer: BC Managed Care – PPO | Admitting: Physician Assistant

## 2016-06-29 ENCOUNTER — Encounter: Payer: BC Managed Care – PPO | Attending: Physician Assistant | Admitting: Dietician

## 2016-06-29 ENCOUNTER — Encounter: Payer: Self-pay | Admitting: Dietician

## 2016-06-29 DIAGNOSIS — Z713 Dietary counseling and surveillance: Secondary | ICD-10-CM | POA: Diagnosis present

## 2016-06-29 DIAGNOSIS — Z794 Long term (current) use of insulin: Secondary | ICD-10-CM

## 2016-06-29 DIAGNOSIS — E119 Type 2 diabetes mellitus without complications: Secondary | ICD-10-CM

## 2016-06-29 NOTE — Progress Notes (Signed)
Patient was seen on 06/29/16 for the first of a series of three diabetes self-management courses at [facility].  Patient Education Plan per assessed needs and concerns is to attend four course education program for Diabetes Self Management Education.  Patient reports weight loss from 275 lbs in October to 259 lbs today.  The following learning objectives were met by the patient during this class:  Describe diabetes  State some common risk factors for diabetes  Defines the role of glucose and insulin  Identifies type of diabetes and pathophysiology  Describe the relationship between diabetes and cardiovascular risk  State the members of the Healthcare Team  States the rationale for glucose monitoring  State when to test glucose  State their individual Target Range  State the importance of logging glucose readings  Describe how to interpret glucose readings  Identifies A1C target  Explain the correlation between A1c and eAG values  State symptoms and treatment of high blood glucose  State symptoms and treatment of low blood glucose  Explain proper technique for glucose testing  Identifies proper sharps disposal  Handouts given during class include:  Living Well with Diabetes book  Carb Counting and Meal Planning book  Meal Plan Card  Carbohydrate guide  Meal planning worksheet  Low Sodium Flavoring Tips  The diabetes portion plate  V4T to eAG Conversion Chart  Diabetes Medications  Diabetes Recommended Care Schedule  Support Group  Diabetes Success Plan  Core Class Satisfaction Survey  Follow-Up Plan:  Attend core 2

## 2016-06-30 ENCOUNTER — Encounter: Payer: Self-pay | Admitting: Physician Assistant

## 2016-06-30 ENCOUNTER — Ambulatory Visit (INDEPENDENT_AMBULATORY_CARE_PROVIDER_SITE_OTHER): Payer: BC Managed Care – PPO | Admitting: Physician Assistant

## 2016-06-30 VITALS — BP 137/85 | HR 89 | Temp 97.9°F | Resp 18 | Ht 61.0 in | Wt 258.8 lb

## 2016-06-30 DIAGNOSIS — Z124 Encounter for screening for malignant neoplasm of cervix: Secondary | ICD-10-CM | POA: Diagnosis not present

## 2016-06-30 DIAGNOSIS — Z23 Encounter for immunization: Secondary | ICD-10-CM

## 2016-06-30 DIAGNOSIS — E119 Type 2 diabetes mellitus without complications: Secondary | ICD-10-CM | POA: Diagnosis not present

## 2016-06-30 DIAGNOSIS — E559 Vitamin D deficiency, unspecified: Secondary | ICD-10-CM

## 2016-06-30 DIAGNOSIS — I10 Essential (primary) hypertension: Secondary | ICD-10-CM | POA: Diagnosis not present

## 2016-06-30 LAB — POCT GLYCOSYLATED HEMOGLOBIN (HGB A1C): Hemoglobin A1C: 6.2

## 2016-06-30 MED ORDER — METFORMIN HCL ER 750 MG PO TB24: 750.0000 mg | ORAL_TABLET | Freq: Every day | ORAL | 0 refills | Status: DC

## 2016-06-30 MED ORDER — LISINOPRIL 20 MG PO TABS: ORAL_TABLET | ORAL | 1 refills | Status: DC

## 2016-06-30 NOTE — Patient Instructions (Addendum)
For diabetes, start taking metformin 750mg  twice daily. This is a minor decrease from your recent metformin dose but we will monitor your A1C at your next visit to make sure it is still controlling your diabetes.Check your blood glucose at home, fasting blood glucose goal is 70-100. I anticipate that with your continued dietary changes and walking you will be able to control your sugars very well. Please follow up in 3 months for reevaluation.   We will contact you within a week with your other labs from today. Thank you for letting me participate in your health and well being.    IF you received an x-ray today, you will receive an invoice from [facility]. Please contact [facility] at [phone] with questions or concerns regarding your invoice.   IF you received labwork today, you will receive an invoice from [facility]. Please contact [facility] at [phone] with questions or concerns regarding your invoice.   Our billing staff will not be able to assist you with questions regarding bills from these companies.  You will be contacted with the lab results as soon as they are available. The fastest way to get your results is to activate your My Chart account. Instructions are located on the last page of this paperwork. If you have not heard from us regarding the results in 2 weeks, please contact this office.

## 2016-06-30 NOTE — Progress Notes (Signed)
MRN: [id number]  Subjective:   Victoria Hansen is a 51 y.o. female who presents for follow up of Type 2 diabetes mellitus.   Diagnosis was made in 2017. Patient is currently managed with metformin 1084m BID..Marland KitchenAdmits good compliance. Denies adverse effects including metallic taste, hypoglycemia, nausea, vomiting.   Patient is checking home blood sugars. Home blood sugar records: BGs consistently in an acceptable range (80s and 90s). Current symptoms include none. Patient denies foot ulcerations, hyperglycemia, nausea, paresthesia of the feet, polydipsia, polyuria, visual disturbances and vomiting. Patient is checking their feet daily. No foot concerns. [facility]   Diet: She has been cooking in more, eating out less. She has cut back on sugary drinks. Really likes sweet tea but has cut down to 5 cups a week from 24 cups a week. Exercise includes walking. Lost 12 lbs since 02/2016. Denies smoking or alcohol use.   Has seen diabetic nutritionist yesterday. Has follow up appointment with them next week. Says it was really helpful.   Known diabetic complications: none  Immunizations: Flu vaccine: 02/2016, pneumococal vaccine: never  Other concerns: 1) Vit d deficiency: Last value in 02/2016 was 10.3. Instructed to start taking OTC vit d 1000 IU. Pt has had good compliance.   2) HBWG:YKZLDJTTSmanaged with lisinopril 256m Patient is checking blood pressure at home, [place] [address]39ystolic.  Denies lightheadedness, dizziness, chronic headache, double vision, chest pain, shortness of breath, heart racing, palpitations, nausea, vomiting, abdominal pain, hematuria, lower leg swelling.  3) She declined pap smear at her CPE in 02/2016 but would like to have one today. Last pap smear was >5 years ago, it was normal.    Objective:   PHYSICAL EXAM BP 137/85 (BP Location: Right Arm, Patient Position: Sitting, Cuff Size: Large)   Pulse 89    Temp 97.9 F (36.6 C) (Oral)   Resp 18   Ht _0  (1.549 m)   Wt 258 lb 12.8 oz (117.4 kg)   LMP 10/26/2015 (Exact Date)   SpO2 100%   BMI 48.90 kg/m   Physical Exam  Constitutional: She is oriented to person, place, and time. She appears well-developed and well-nourished. No distress.  HENT:  Head: Normocephalic and atraumatic.  Mouth/Throat: Uvula is midline, oropharynx is clear and moist and mucous membranes are normal.  Eyes: Conjunctivae are normal.  Neck: Normal range of motion.  Cardiovascular: Normal rate, regular rhythm, normal heart sounds and intact distal pulses.   Pulmonary/Chest: Effort normal and breath sounds normal.  Abdominal: Soft. Bowel sounds are normal. There is no tenderness.  Genitourinary: Vagina normal and uterus normal. There is no rash, tenderness or lesion on the right labia. There is no rash, tenderness or lesion on the left labia. Cervix exhibits no motion tenderness and no discharge. Right adnexum displays no mass and no tenderness. Left adnexum displays no mass and no tenderness.  Neurological: She is alert and oriented to person, place, and time.  Skin: Skin is warm and dry.  Psychiatric: She has a normal mood and affect.  Vitals reviewed.   Diabetic Foot Exam - Simple   Simple Foot Form Visual Inspection No deformities, no ulcerations, no other skin breakdown bilaterally:  Yes Sensation Testing Intact to touch and monofilament testing bilaterally:  Yes Pulse Check Posterior Tibialis and Dorsalis pulse intact bilaterally:  Yes Comments     Results for orders placed or performed in visit on 06/30/16 (from the past  24 hour(s))  POCT glycosylated hemoglobin (Hb A1C)     Status: Normal   Collection Time: 06/30/16 11:40 AM  Result Value Ref Range   Hemoglobin A1C 6.2     Wt Readings from Last 3 Encounters:  06/30/16 258 lb 12.8 oz (117.4 kg)  06/29/16 259 lb (117.5 kg)  03/10/16 270 lb (122.5 kg)    Assessment and Plan :  1. Type 2  diabetes mellitus without complication, without long-term current use of insulin (HCC) Well controlled. Pt's A1C has decreased from 6.5 to 6.2. She has been working hard on lifestyle modifications. Congratulated her on her 12 lb weight loss over the past 4 months. Encouraged her to continue the hard work! Will decrease metformin dose to 757m BID as XR. Plan to follow up in 3 months, if A1C remains stable, continue this dose. If A1C increases, will increase dose to 10013mBID. Instructed to continue checking sugars at home. FBS goal is 70-100.  - HM Diabetes Foot Exam - POCT glycosylated hemoglobin (Hb A1C) - CMP14+EGFR - Pneumococcal polysaccharide vaccine 23-valent greater than or equal to 2yo subcutaneous/IM - metFORMIN (GLUCOPHAGE-XR) 750 MG 24 hr tablet; Take 1 tablet (750 mg total) by mouth daily with breakfast.  Dispense: 180 tablet; Refill: 0  2. Vitamin D deficiency Labs pending - VITAMIN D 25 Hydroxy (Vit-D Deficiency, Fractures)  3. Screening for cervical cancer - Pap IG and HPV (high risk) DNA detection  4. Essential hypertension Controlled. Continue monitoring bp outside the office.  - lisinopril (PRINIVIL,ZESTRIL) 20 MG tablet; TAKE 1 TABLET BY MOUTH DAILY  Dispense: 90 tablet; Refill: 1 BriarPA-C  [facility] [place]/2018 12:20 PM

## 2016-07-01 LAB — CMP14+EGFR
A/G RATIO: 1.2 (ref 1.2–2.2)
ALBUMIN: 4.1 g/dL (ref 3.5–5.5)
ALT: 16 IU/L (ref 0–32)
AST: 17 IU/L (ref 0–40)
Alkaline Phosphatase: 123 IU/L — ABNORMAL HIGH (ref 39–117)
BUN / CREAT RATIO: 15 (ref 9–23)
BUN: 14 mg/dL (ref 6–24)
Bilirubin Total: 0.4 mg/dL (ref 0.0–1.2)
CALCIUM: 10.1 mg/dL (ref 8.7–10.2)
CO2: 24 mmol/L (ref 20–29)
CREATININE: 0.94 mg/dL (ref 0.57–1.00)
Chloride: 100 mmol/L (ref 96–106)
GFR, EST AFRICAN AMERICAN: 82 mL/min/{1.73_m2} (ref >59)
GFR, EST NON AFRICAN AMERICAN: 71 mL/min/{1.73_m2} (ref >59)
Globulin, Total: 3.5 g/dL (ref 1.5–4.5)
Glucose: 85 mg/dL (ref 65–99)
POTASSIUM: 4.6 mmol/L (ref 3.5–5.2)
SODIUM: 140 mmol/L (ref 134–144)
TOTAL PROTEIN: 7.6 g/dL (ref 6.0–8.5)

## 2016-07-01 LAB — VITAMIN D 25 HYDROXY (VIT D DEFICIENCY, FRACTURES): Vit D, 25-Hydroxy: 34.2 ng/mL (ref 30.0–100.0)

## 2016-07-02 LAB — PAP IG AND HPV HIGH-RISK
HPV, high-risk: NEGATIVE
PAP Smear Comment: 0

## 2016-07-03 NOTE — Progress Notes (Signed)
Please call pt and let her know her labs look fantastic - kidneys, liver, salts in her blood and vitamin D level are within normal limits. Her PAP is negative. Next PAP in 3 years. Thank you!

## 2016-07-06 ENCOUNTER — Encounter: Payer: BC Managed Care – PPO | Admitting: Dietician

## 2016-07-06 DIAGNOSIS — Z713 Dietary counseling and surveillance: Secondary | ICD-10-CM | POA: Diagnosis not present

## 2016-07-06 DIAGNOSIS — E119 Type 2 diabetes mellitus without complications: Secondary | ICD-10-CM

## 2016-07-06 NOTE — Progress Notes (Signed)

## 2016-07-13 ENCOUNTER — Encounter: Payer: BC Managed Care – PPO | Attending: Family Medicine | Admitting: Dietician

## 2016-07-13 DIAGNOSIS — Z713 Dietary counseling and surveillance: Secondary | ICD-10-CM | POA: Insufficient documentation

## 2016-07-13 DIAGNOSIS — Z794 Long term (current) use of insulin: Secondary | ICD-10-CM

## 2016-07-13 DIAGNOSIS — E119 Type 2 diabetes mellitus without complications: Secondary | ICD-10-CM | POA: Insufficient documentation

## 2016-07-13 NOTE — Progress Notes (Signed)
Patient was seen on 07/13/16 for the third of a series of three diabetes self-management courses at [facility].   Victoria Hansen. State the amount of activity recommended for healthy living . Describe activities suitable for individual needs . Identify ways to regularly incorporate activity into daily life . Identify barriers to activity and ways to over come these barriers  Identify diabetes medications being personally used and their primary action for lowering glucose and possible side effects . Describe role of stress on blood glucose and develop strategies to address psychosocial issues . Identify diabetes complications and ways to prevent them  Explain how to manage diabetes during illness . Evaluate success in meeting personal goal . Establish 2-3 goals that they will plan to diligently work on until they return for the  342-month follow-up visit  Goals:   I will count my carb choices at most meals and snacks  I will be active 30 minutes or more 5 times a week  I will eat less unhealthy fats by eating less processed food and food from restaurants.  Your patient has identified these potential barriers to change:  None   Your patient has identified their diabetes self-care support plan as  Family Radiographer, therapeuticupport On-line Resources (American Diabetes Association)  Plan:  Attend Support Group as desired

## 2016-10-06 ENCOUNTER — Ambulatory Visit (INDEPENDENT_AMBULATORY_CARE_PROVIDER_SITE_OTHER): Payer: BC Managed Care – PPO | Admitting: Physician Assistant

## 2016-10-06 ENCOUNTER — Encounter: Payer: Self-pay | Admitting: Physician Assistant

## 2016-10-06 VITALS — BP 148/84 | HR 80 | Temp 98.2°F | Resp 17 | Ht 60.0 in | Wt 264.0 lb

## 2016-10-06 DIAGNOSIS — I10 Essential (primary) hypertension: Secondary | ICD-10-CM | POA: Diagnosis not present

## 2016-10-06 MED ORDER — HYDROCHLOROTHIAZIDE 12.5 MG PO CAPS: 12.5000 mg | ORAL_CAPSULE | Freq: Every day | ORAL | 0 refills | Status: DC

## 2016-10-06 NOTE — Patient Instructions (Addendum)
GoodRx.com is a website you can type in medications into and get coupons for.   In terms of high blood pressure, I recommend starting exercise and eating healthier food options! I am also going to add a blood pressure medication called hctz to your regimen. Take this along with lisinopril daily. Please try and check your bp outside of the office. . Your goal is <140/90 and >100/60. If your values are consistently above this goal, please return to office for further evaluation. If they remain <140/90, return in 3 months and we will obtain labs at that visit.  If you start to have chest pain, blurred vision, shortness of breath, severe headache, lower leg swelling, or nausea/vomiting please seek care immediately here or at the ED.     DASH Eating Plan DASH stands for "Dietary Approaches to Stop Hypertension." The DASH eating plan is a healthy eating plan that has been shown to reduce high blood pressure (hypertension). It may also reduce your risk for type 2 diabetes, heart disease, and stroke. The DASH eating plan may also help with weight loss. What are tips for following this plan? General guidelines  Avoid eating more than 2,300 mg (milligrams) of salt (sodium) a day. If you have hypertension, you may need to reduce your sodium intake to 1,500 mg a day.  Limit alcohol intake to no more than 1 drink a day for nonpregnant women and 2 drinks a day for men. One drink equals 12 oz of beer, 5 oz of wine, or 1 oz of hard liquor.  Work with your health care provider to maintain a healthy body weight or to lose weight. Ask what an ideal weight is for you.  Get at least 30 minutes of exercise that causes your heart to beat faster (aerobic exercise) most days of the week. Activities may include walking, swimming, or biking.  Work with your health care provider or diet and nutrition specialist (dietitian) to adjust your eating plan to your individual calorie needs. Reading food labels  Check food  labels for the amount of sodium per serving. Choose foods with less than 5 percent of the Daily Value of sodium. Generally, foods with less than 300 mg of sodium per serving fit into this eating plan.  To find whole grains, look for the word "whole" as the first word in the ingredient list. Shopping  Buy products labeled as "low-sodium" or "no salt added."  Buy fresh foods. Avoid canned foods and premade or frozen meals. Cooking  Avoid adding salt when cooking. Use salt-free seasonings or herbs instead of table salt or sea salt. Check with your health care provider or pharmacist before using salt substitutes.  Do not fry foods. Cook foods using healthy methods such as baking, boiling, grilling, and broiling instead.  Cook with heart-healthy oils, such as olive, canola, soybean, or sunflower oil. Meal planning   Eat a balanced diet that includes: ? 5 or more servings of fruits and vegetables each day. At each meal, try to fill half of your plate with fruits and vegetables. ? Up to 6-8 servings of whole grains each day. ? Less than 6 oz of lean meat, poultry, or fish each day. A 3-oz serving of meat is about the same size as a deck of cards. One egg equals 1 oz. ? 2 servings of low-fat dairy each day. ? A serving of nuts, seeds, or beans 5 times each week. ? Heart-healthy fats. Healthy fats called Omega-3 fatty acids are found in foods  such as flaxseeds and coldwater fish, like sardines, salmon, and mackerel.  Limit how much you eat of the following: ? Canned or prepackaged foods. ? Food that is high in trans fat, such as fried foods. ? Food that is high in saturated fat, such as fatty meat. ? Sweets, desserts, sugary drinks, and other foods with added sugar. ? Full-fat dairy products.  Do not salt foods before eating.  Try to eat at least 2 vegetarian meals each week.  Eat more home-cooked food and less restaurant, buffet, and fast food.  When eating at a restaurant, ask that  your food be prepared with less salt or no salt, if possible. What foods are recommended? The items listed may not be a complete list. Talk with your dietitian about what dietary choices are best for you. Grains Whole-grain or whole-wheat bread. Whole-grain or whole-wheat pasta. Brown rice. [place]. Bulgur. Whole-grain and low-sodium cereals. Pita bread. Low-fat, low-sodium crackers. Whole-wheat flour tortillas. Vegetables Fresh or frozen vegetables (raw, steamed, roasted, or grilled). Low-sodium or reduced-sodium tomato and vegetable juice. Low-sodium or reduced-sodium tomato sauce and tomato paste. Low-sodium or reduced-sodium canned vegetables. Fruits All fresh, dried, or frozen fruit. Canned fruit in natural juice (without added sugar). Meat and other protein foods Skinless chicken or [place]. Ground chicken or [place]. Pork with fat trimmed off. Fish and seafood. Egg whites. Dried beans, peas, or lentils. Unsalted nuts, nut butters, and seeds. Unsalted canned beans. Lean cuts of beef with fat trimmed off. Low-sodium, lean deli meat. Dairy Low-fat (1%) or fat-free (skim) milk. Fat-free, low-fat, or reduced-fat cheeses. Nonfat, low-sodium ricotta or cottage cheese. Low-fat or nonfat yogurt. Low-fat, low-sodium cheese. Fats and oils Soft margarine without trans fats. Vegetable oil. Low-fat, reduced-fat, or light mayonnaise and salad dressings (reduced-sodium). Canola, safflower, olive, soybean, and sunflower oils. Avocado. Seasoning and other foods Herbs. Spices. Seasoning mixes without salt. Unsalted popcorn and pretzels. Fat-free sweets. What foods are not recommended? The items listed may not be a complete list. Talk with your dietitian about what dietary choices are best for you. Grains Baked goods made with fat, such as croissants, muffins, or some breads. Dry pasta or rice meal packs. Vegetables Creamed or fried vegetables. Vegetables in a cheese sauce. Regular canned vegetables  (not low-sodium or reduced-sodium). Regular canned tomato sauce and paste (not low-sodium or reduced-sodium). Regular tomato and vegetable juice (not low-sodium or reduced-sodium). Angie Fava. Olives. Fruits Canned fruit in a light or heavy syrup. Fried fruit. Fruit in cream or butter sauce. Meat and other protein foods Fatty cuts of meat. Ribs. Fried meat. Berniece Salines. Sausage. Bologna and other processed lunch meats. Salami. Fatback. Hotdogs. Bratwurst. Salted nuts and seeds. Canned beans with added salt. Canned or smoked fish. Whole eggs or egg yolks. Chicken or [place] with skin. Dairy Whole or 2% milk, cream, and half-and-half. Whole or full-fat cream cheese. Whole-fat or sweetened yogurt. Full-fat cheese. Nondairy creamers. Whipped toppings. Processed cheese and cheese spreads. Fats and oils Butter. Stick margarine. Lard. Shortening. Ghee. Bacon fat. Tropical oils, such as coconut, palm kernel, or palm oil. Seasoning and other foods Salted popcorn and pretzels. Onion salt, garlic salt, seasoned salt, table salt, and sea salt. Worcestershire sauce. Tartar sauce. Barbecue sauce. Teriyaki sauce. Soy sauce, including reduced-sodium. Steak sauce. Canned and packaged gravies. Fish sauce. Oyster sauce. Cocktail sauce. Horseradish that you find on the shelf. Ketchup. Mustard. Meat flavorings and tenderizers. Bouillon cubes. Hot sauce and Tabasco sauce. Premade or packaged marinades. Premade or packaged taco seasonings. Relishes. Regular salad  dressings. Where to find more information:  National Heart, Lung, and Blood Institute: PopSteam.is  American Heart Association: www.heart.org Summary  The DASH eating plan is a healthy eating plan that has been shown to reduce high blood pressure (hypertension). It may also reduce your risk for type 2 diabetes, heart disease, and stroke.  With the DASH eating plan, you should limit salt (sodium) intake to 2,300 mg a day. If you have hypertension, you may need to  reduce your sodium intake to 1,500 mg a day.  When on the DASH eating plan, aim to eat more fresh fruits and vegetables, whole grains, lean proteins, low-fat dairy, and heart-healthy fats.  Work with your health care provider or diet and nutrition specialist (dietitian) to adjust your eating plan to your individual calorie needs. This information is not intended to replace advice given to you by your health care provider. Make sure you discuss any questions you have with your health care provider. Document Released: 12/17/2010 Document Revised: 12/22/2015 Document Reviewed: 12/22/2015 Elsevier Interactive Patient Education  2017 [employer].    IF you received an x-ray today, you will receive an invoice from [facility]. Please contact [facility] at [phone] with questions or concerns regarding your invoice.   IF you received labwork today, you will receive an invoice from [place]. Please contact [facility] at [phone] with questions or concerns regarding your invoice.   Our billing staff will not be able to assist you with questions regarding bills from these companies.  You will be contacted with the lab results as soon as they are available. The fastest way to get your results is to activate your My Chart account. Instructions are located on the last page of this paperwork. If you have not heard from [place] regarding the results in 2 weeks, please contact this office.

## 2016-10-06 NOTE — Progress Notes (Signed)
    MRN: [id number] DOB: 1965/07/01  Subjective:   Victoria Hansen is a 51 y.o. female presenting for follow up on Hypertension. Has had dx since ~2014. Currently managed with lisinopril . Patient is not checking blood pressure at home. Reports no symptoms. Denies lightheadedness, dizziness, chronic headache, double vision, chest pain, shortness of breath, heart racing, palpitations, nausea, vomiting, abdominal pain, hematuria, worsening lower leg swelling. Denies smoking or alcohol use. Has decreased exercise lately. Has not been adding any salt to her foods but has not been eating great lately. Still trying to avoid fried foods. No history of gout. Has FH of HTN in mother and father. Denies any other aggravating or relieving factors, no other questions or concerns.  Victoria Hansen has a current medication list which includes the following prescription(s): aspirin ec, atorvastatin, blood glucose monitor system, cholecalciferol, lisinopril, and metformin. Also has No Known Allergies.  Victoria Hansen  has a past medical history of Diabetes mellitus without complication (HCC) and Hypertension. Also  has a past surgical history that includes Tubal ligation.   Objective:   Vitals: BP (!) 140/94   Pulse (!) 112   Temp 98.2 F (36.8 C) (Oral)   Resp 17   Ht 5' (1.524 m)   Wt 264 lb (119.7 kg)   LMP 10/26/2015 (Exact Date)   SpO2 98%   BMI 51.56 kg/m   Physical Exam  Constitutional: She is oriented to person, place, and time. She appears well-developed and well-nourished.  HENT:  Head: Normocephalic and atraumatic.  Eyes: Conjunctivae are normal.  Neck: Normal range of motion.  Cardiovascular: Normal rate, regular rhythm, normal heart sounds and intact distal pulses.   Pulmonary/Chest: Effort normal and breath sounds normal. She has no wheezes. She has no rales.  Musculoskeletal:       Right lower leg: She exhibits edema ( 2+ pitting edema to knee).       Left lower leg: She exhibits edema ( 2+  pitting edema to knee). Swelling:    Neurological: She is alert and oriented to person, place, and time.  Skin: Skin is warm and dry.  Psychiatric: She has a normal mood and affect.  Vitals reviewed.   No results found for this or any previous visit (from the past 24 hour(s)).  BP Readings from Last 3 Encounters:  10/06/16 (!) 140/94  06/30/16 137/85  03/10/16 (!) 150/88   Wt Readings from Last 3 Encounters:  10/06/16 264 lb (119.7 kg)  06/30/16 258 lb 12.8 oz (117.4 kg)  06/29/16 259 lb (117.5 kg)     Assessment and Plan :  1. Essential hypertension Uncontrolled in office today. Asymptomatic.Discussed lifestyle modifications, such as DASH Diet and exercise, which can aid in lowering blood pressure. She has gained 6lbs since last visit. Will add 2nd bp agent to bp regimen at this time. Instructed to check bp outside of office. Return if consistently >140/90. Given strict ED precautions. Otherwise, if bp remains controlled <140/90, return in 3 months for reevaluation. Will obtain labs for HTN and diabetic follow up at that appointment.  - hydrochlorothiazide (MICROZIDE) 12.5 MG capsule; Take 1 capsule (12.5 mg total) by mouth daily.  Dispense: 90 capsule; Refill: 0   Benjiman Core, PA-C  [facility] at [facility] 10/06/2016 6:22 PM

## 2016-12-27 ENCOUNTER — Telehealth: Payer: Self-pay | Admitting: Physician Assistant

## 2016-12-27 NOTE — Telephone Encounter (Signed)
Called pt. To reschedule her appointment from Wednesday 12/29/16 to 12/30/16 due to a conflict in [place].   If pt. Calls back, please let her make an appointment for 12/30/16 or any other day she chooses.

## 2016-12-29 ENCOUNTER — Ambulatory Visit: Payer: BC Managed Care – PPO | Admitting: Physician Assistant

## 2017-01-07 ENCOUNTER — Other Ambulatory Visit: Payer: Self-pay

## 2017-01-07 ENCOUNTER — Ambulatory Visit: Payer: BC Managed Care – PPO | Admitting: Physician Assistant

## 2017-01-07 ENCOUNTER — Encounter: Payer: Self-pay | Admitting: Physician Assistant

## 2017-01-07 VITALS — BP 138/87 | HR 97 | Temp 98.2°F | Resp 16 | Ht 60.0 in | Wt 262.8 lb

## 2017-01-07 DIAGNOSIS — E1169 Type 2 diabetes mellitus with other specified complication: Secondary | ICD-10-CM | POA: Insufficient documentation

## 2017-01-07 DIAGNOSIS — E119 Type 2 diabetes mellitus without complications: Secondary | ICD-10-CM

## 2017-01-07 DIAGNOSIS — E785 Hyperlipidemia, unspecified: Secondary | ICD-10-CM | POA: Diagnosis not present

## 2017-01-07 DIAGNOSIS — Z1211 Encounter for screening for malignant neoplasm of colon: Secondary | ICD-10-CM | POA: Diagnosis not present

## 2017-01-07 DIAGNOSIS — Z1231 Encounter for screening mammogram for malignant neoplasm of breast: Secondary | ICD-10-CM

## 2017-01-07 DIAGNOSIS — Z1239 Encounter for other screening for malignant neoplasm of breast: Secondary | ICD-10-CM

## 2017-01-07 DIAGNOSIS — I10 Essential (primary) hypertension: Secondary | ICD-10-CM

## 2017-01-07 DIAGNOSIS — Z23 Encounter for immunization: Secondary | ICD-10-CM

## 2017-01-07 MED ORDER — HYDROCHLOROTHIAZIDE 12.5 MG PO CAPS: 12.5000 mg | ORAL_CAPSULE | Freq: Every day | ORAL | 1 refills | Status: DC

## 2017-01-07 MED ORDER — LISINOPRIL 20 MG PO TABS: ORAL_TABLET | ORAL | 1 refills | Status: DC

## 2017-01-07 MED ORDER — METFORMIN HCL ER 750 MG PO TB24: 750.0000 mg | ORAL_TABLET | Freq: Two times a day (BID) | ORAL | 1 refills | Status: DC

## 2017-01-07 MED ORDER — ATORVASTATIN CALCIUM 40 MG PO TABS: 40.0000 mg | ORAL_TABLET | Freq: Every day | ORAL | 3 refills | Status: DC

## 2017-01-07 NOTE — Progress Notes (Signed)
Victoria Hansen  MRN: [id number] DOB: Aug 24, 1965  Subjective:  Victoria Hansen is a 51 year old female here today for chief complaint of follow-up on diabetes and hypertension. Pt is fasting today.   T2DM: Diagnosis was made in 2017. Patient is currently managed with metformin XR 739m once daily. Has missed a few doses here and there. Denies adverse effects including metallic taste, hypoglycemia, nausea, vomiting.   Patient is checking home blood sugars occassionally. Home blood sugar records: BGs consistently in an acceptable range (90s). Current symptoms include none. Patient denies foot ulcerations, hyperglycemia, nausea, paresthesia of the feet, polydipsia, polyuria, visual disturbances and vomiting. Patient is checking their feet daily. No foot concerns. Last diabetic eye exam [facility]: [facility] normal.   Diet: She has started back drinking some sodas and occasional sweet teas. She is still cooking at home and eating out less. Exercise includes walking but has been decreased during this winter. Denies smoking or alcohol use.   Known diabetic complications: none  Immunizations: Flu vaccine: 02/2016, pneumococal vaccine: 06/2016  She was started lipitor 418min 03/2016. Has been taking that consistently. No issues with medication.   Other concerns: 1) HTTUU:EKCMKLKJZanaged with lisinopril 2033mnd HCTZ 12.5mg69matient is checking blood pressure at home, range is [phone]. She uses a wrist monitor and has not brought it in to compare to our values.  Denies lightheadedness, dizziness, chronic headache, double vision, chest pain, shortness of breath, heart racing, palpitations, nausea, vomiting, abdominal pain, hematuria, and worsening lower leg swelling.  Review of Systems  Per HPI  Patient Active Problem List   Diagnosis Date Noted  . Diabetes mellitus type 2, controlled, without complications (HCC)[place]/069/79/4801Morbid obesity (HCC)[place]/28/2014  .  Essential hypertension, benign 12/08/2012    Current Outpatient Medications on File Prior to Visit  Medication Sig Dispense Refill  . aspirin EC 81 MG tablet Take 81 mg by mouth daily.    . atMarland Kitchenrvastatin (LIPITOR) 40 MG tablet Take 1 tablet (40 mg total) by mouth daily. 90 tablet 3  . Blood Glucose Monitoring Suppl (BLOOD GLUCOSE MONITOR SYSTEM) w/Device KIT 1 Units by Does not apply route daily. 1 each 0  . cholecalciferol (VITAMIN D) 1000 units tablet Take 1,000 Units by mouth daily.    . hydrochlorothiazide (MICROZIDE) 12.5 MG capsule Take 1 capsule (12.5 mg total) by mouth daily. 90 capsule 0  . lisinopril (PRINIVIL,ZESTRIL) 20 MG tablet TAKE 1 TABLET BY MOUTH DAILY 90 tablet 1  . metFORMIN (GLUCOPHAGE-XR) 750 MG 24 hr tablet Take 1 tablet (750 mg total) by mouth daily with breakfast. 180 tablet 0   No current facility-administered medications on file prior to visit.     No Known Allergies   Objective:  BP 138/87 (BP Location: Right Arm, Patient Position: Sitting, Cuff Size: Large)   Pulse 97   Temp 98.2 F (36.8 C) (Oral)   Resp 16   Ht 5' (1.524 m)   Wt 262 lb 12.8 oz (119.2 kg)   LMP 10/26/2015 (Exact Date)   SpO2 98%   BMI 51.32 kg/m   Physical Exam  Constitutional: She is oriented to person, place, and time and well-developed, well-nourished, and in no distress.  HENT:  Head: Normocephalic and atraumatic.  Mouth/Throat: Uvula is midline, oropharynx is clear and moist and mucous membranes are normal.  Eyes: Conjunctivae are normal.  Neck: Normal range of motion.  Cardiovascular: Normal rate, regular rhythm, normal heart sounds and intact distal pulses.  Pulmonary/Chest: Effort normal and breath sounds normal. She has no wheezes. She has no rhonchi. She has no rales.  Musculoskeletal:       Right lower leg: She exhibits edema (2+ to mid shin ). She exhibits no tenderness.       Left lower leg: She exhibits edema (2+ to midshin  ). She exhibits no tenderness.    Neurological: She is alert and oriented to person, place, and time. Gait normal.  Skin: Skin is warm and dry.  Psychiatric: Affect normal.  Vitals reviewed.    Wt Readings from Last 3 Encounters:  01/07/17 262 lb 12.8 oz (119.2 kg)  10/06/16 264 lb (119.7 kg)  06/30/16 258 lb 12.8 oz (117.4 kg)   BP Readings from Last 3 Encounters:  01/07/17 138/87  10/06/16 (!) 148/84  06/30/16 137/85    Assessment and Plan :  1. Type 2 diabetes mellitus without complication, without long-term current use of insulin ([place]) Labs pending.  Patient was supposed to be taking metformin XR 750 mg BID but has only been taking it once a day due to miscommunication.  Labs pending.  Educated patient to increase dose to twice daily until we have her results back.  At that point, we will discuss further treatment plan.  Encouraged to increase exercise and continue making healthy dietary choices.  Plan to follow-up in 6 months for reevaluation. - Lipid panel - CBC with Differential/Platelet - CMP14+EGFR - Hemoglobin A1c - metFORMIN (GLUCOPHAGE-XR) 750 MG 24 hr tablet; Take 1 tablet (750 mg total) by mouth 2 (two) times daily with a meal.  Dispense: 180 tablet; Refill: 1 - atorvastatin (LIPITOR) 40 MG tablet; Take 1 tablet (40 mg total) by mouth daily.  Dispense: 90 tablet; Refill: 3  2. Essential hypertension Controlled in office.  Patient concerned that her blood pressure monitor is not reading accurately at home.  Recommended she return to office in the next couple weeks with her blood pressure monitor we will compare it to our in her office readings.  Continue current medication regimen. - Lipid panel - CBC with Differential/Platelet - CMP14+EGFR - lisinopril (PRINIVIL,ZESTRIL) 20 MG tablet; TAKE 1 TABLET BY MOUTH DAILY  Dispense: 90 tablet; Refill: 1 - hydrochlorothiazide (MICROZIDE) 12.5 MG capsule; Take 1 capsule (12.5 mg total) by mouth daily.  Dispense: 90 capsule; Refill: 1  3. Screen for colon  cancer - Ambulatory referral to Gastroenterology  4. Screening for breast cancer - MM Digital Screening; Future  5. Need for influenza vaccination - Flu Vaccine QUAD 36+ mos IM  6. Hyperlipidemia, unspecified hyperlipidemia type Lipid panel pending.  Continue with current Lipitor dose. - atorvastatin (LIPITOR) 40 MG tablet; Take 1 tablet (40 mg total) by mouth daily.  Dispense: 90 tablet; Refill: [place] PA-C  [facility] 01/07/2017 10:36 AM

## 2017-01-07 NOTE — Patient Instructions (Addendum)
It was a pleasure seeing you today!  In terms of blood pressure, I recommend continuing taking lisinopril and hydrochlorothiazide as you are.  In the next couple weeks, bring your wrist blood pressure monitor by and compare it to our monitors in office.  In terms of your diabetes management, increase metformin dose to twice a day.  Once we have your A1c, I will contact you and discuss further treatment plan.  Continue with current Lipitor dose.  Over the next couple months, start increasing exercise as you were and continue making healthy dietary choices.  I have placed a referral for GI and they should contact you within the next couple weeks to schedule an appointment for colonoscopy.  I have also placed an order for mammogram.  Plan to follow-up in 6 months for reevaluation.  Diabetes Mellitus and Nutrition When you have diabetes (diabetes mellitus), it is very important to have healthy eating habits because your blood sugar (glucose) levels are greatly affected by what you eat and drink. Eating healthy foods in the appropriate amounts, at about the same times every day, can help you:  Control your blood glucose.  Lower your risk of heart disease.  Improve your blood pressure.  Reach or maintain a healthy weight.  Every person with diabetes is different, and each person has different needs for a meal plan. Your health care provider may recommend that you work with a diet and nutrition specialist (dietitian) to make a meal plan that is best for you. Your meal plan may vary depending on factors such as:  The calories you need.  The medicines you take.  Your weight.  Your blood glucose, blood pressure, and cholesterol levels.  Your activity level.  Other health conditions you have, such as heart or kidney disease.  How do carbohydrates affect me? Carbohydrates affect your blood glucose level more than any other type of food. Eating carbohydrates naturally increases the amount of glucose  in your blood. Carbohydrate counting is a method for keeping track of how many carbohydrates you eat. Counting carbohydrates is important to keep your blood glucose at a healthy level, especially if you use insulin or take certain oral diabetes medicines. It is important to know how many carbohydrates you can safely have in each meal. This is different for every person. Your dietitian can help you calculate how many carbohydrates you should have at each meal and for snack. Foods that contain carbohydrates include:  Bread, cereal, rice, pasta, and crackers.  Potatoes and corn.  Peas, beans, and lentils.  Milk and yogurt.  Fruit and juice.  Desserts, such as cakes, cookies, ice cream, and candy.  How does alcohol affect me? Alcohol can cause a sudden decrease in blood glucose (hypoglycemia), especially if you use insulin or take certain oral diabetes medicines. Hypoglycemia can be a life-threatening condition. Symptoms of hypoglycemia (sleepiness, dizziness, and confusion) are similar to symptoms of having too much alcohol. If your health care provider says that alcohol is safe for you, follow these guidelines:  Limit alcohol intake to no more than 1 drink per day for nonpregnant women and 2 drinks per day for men. One drink equals 12 oz of beer, 5 oz of wine, or 1 oz of hard liquor.  Do not drink on an empty stomach.  Keep yourself hydrated with water, diet soda, or unsweetened iced tea.  Keep in mind that regular soda, juice, and other mixers may contain a lot of sugar and must be counted as carbohydrates.  What are tips for following this plan? Reading food labels  Start by checking the serving size on the label. The amount of calories, carbohydrates, fats, and other nutrients listed on the label are based on one serving of the food. Many foods contain more than one serving per package.  Check the total grams (g) of carbohydrates in one serving. You can calculate the number of  servings of carbohydrates in one serving by dividing the total carbohydrates by 15. For example, if a food has 30 g of total carbohydrates, it would be equal to 2 servings of carbohydrates.  Check the number of grams (g) of saturated and trans fats in one serving. Choose foods that have low or no amount of these fats.  Check the number of milligrams (mg) of sodium in one serving. Most people should limit total sodium intake to less than 2,300 mg per day.  Always check the nutrition information of foods labeled as "low-fat" or "nonfat". These foods may be higher in added sugar or refined carbohydrates and should be avoided.  Talk to your dietitian to identify your daily goals for nutrients listed on the label. Shopping  Avoid buying canned, premade, or processed foods. These foods tend to be high in fat, sodium, and added sugar.  Shop around the outside edge of the grocery store. This includes fresh fruits and vegetables, bulk grains, fresh meats, and fresh dairy. Cooking  Use low-heat cooking methods, such as baking, instead of high-heat cooking methods like deep frying.  Cook using healthy oils, such as olive, canola, or sunflower oil.  Avoid cooking with butter, cream, or high-fat meats. Meal planning  Eat meals and snacks regularly, preferably at the same times every day. Avoid going long periods of time without eating.  Eat foods high in fiber, such as fresh fruits, vegetables, beans, and whole grains. Talk to your dietitian about how many servings of carbohydrates you can eat at each meal.  Eat 4-6 ounces of lean protein each day, such as lean meat, chicken, fish, eggs, or tofu. 1 ounce is equal to 1 ounce of meat, chicken, or fish, 1 egg, or 1/4 cup of tofu.  Eat some foods each day that contain healthy fats, such as avocado, nuts, seeds, and fish. Lifestyle   Check your blood glucose regularly.  Exercise at least 30 minutes 5 or more days each week, or as told by your health  care provider.  Take medicines as told by your health care provider.  Do not use any products that contain nicotine or tobacco, such as cigarettes and e-cigarettes. If you need help quitting, ask your health care provider.  Work with a Veterinary surgeoncounselor or diabetes educator to identify strategies to manage stress and any emotional and social challenges. What are some questions to ask my health care provider?  Do I need to meet with a diabetes educator?  Do I need to meet with a dietitian?  What number can I call if I have questions?  When are the best times to check my blood glucose? Where to find more information:  American Diabetes Association: diabetes.org/food-and-fitness/food  Academy of Nutrition and Dietetics: https://www.vargas.com/www.eatright.org/resources/health/diseases-and-conditions/diabetes  [employer] of Diabetes and Digestive and Kidney Diseases (NIH): FindJewelers.czwww.niddk.nih.gov/health-information/diabetes/overview/diet-eating-physical-activity Summary  A healthy meal plan will help you control your blood glucose and maintain a healthy lifestyle.  Working with a diet and nutrition specialist (dietitian) can help you make a meal plan that is best for you.  Keep in mind that carbohydrates and alcohol have immediate  effects on your blood glucose levels. It is important to count carbohydrates and to use alcohol carefully. This information is not intended to replace advice given to you by your health care provider. Make sure you discuss any questions you have with your health care provider. Document Released: 09/24/2004 Document Revised: 02/02/2016 Document Reviewed: 02/02/2016 Elsevier Interactive Patient Education  2018 [employer].    IF you received an x-ray today, you will receive an invoice from [facility]. Please contact [facility] at [phone] with questions or concerns regarding your invoice.   IF you received labwork today, you will receive an invoice from  [facility]. Please contact [facility] at [phone] with questions or concerns regarding your invoice.   Our billing staff will not be able to assist you with questions regarding bills from these companies.  You will be contacted with the lab results as soon as they are available. The fastest way to get your results is to activate your My Chart account. Instructions are located on the last page of this paperwork. If you have not heard from [place] regarding the results in 2 weeks, please contact this office.

## 2017-01-08 ENCOUNTER — Other Ambulatory Visit: Payer: Self-pay | Admitting: Physician Assistant

## 2017-01-08 DIAGNOSIS — I10 Essential (primary) hypertension: Secondary | ICD-10-CM

## 2017-01-08 LAB — CBC WITH DIFFERENTIAL/PLATELET
BASOS ABS: 0.1 10*3/uL (ref 0.0–0.2)
Basos: 1 %
EOS (ABSOLUTE): 0.2 10*3/uL (ref 0.0–0.4)
Eos: 3 %
HEMOGLOBIN: 11.2 g/dL (ref 11.1–15.9)
Hematocrit: 34.2 % (ref 34.0–46.6)
IMMATURE GRANS (ABS): 0 10*3/uL (ref 0.0–0.1)
IMMATURE GRANULOCYTES: 0 %
LYMPHS ABS: 2.8 10*3/uL (ref 0.7–3.1)
LYMPHS: 45 %
MCH: 26.7 pg (ref 26.6–33.0)
MCHC: 32.7 g/dL (ref 31.5–35.7)
MCV: 81 fL (ref 79–97)
MONOCYTES: 6 %
Monocytes Absolute: 0.4 10*3/uL (ref 0.1–0.9)
NEUTROS PCT: 45 %
Neutrophils Absolute: 2.8 10*3/uL (ref 1.4–7.0)
Platelets: 365 10*3/uL (ref 150–379)
RBC: 4.2 x10E6/uL (ref 3.77–5.28)
RDW: 16.2 % — ABNORMAL HIGH (ref 12.3–15.4)
WBC: 6.3 10*3/uL (ref 3.4–10.8)

## 2017-01-08 LAB — CMP14+EGFR
ALBUMIN: 3.9 g/dL (ref 3.5–5.5)
ALK PHOS: 106 IU/L (ref 39–117)
ALT: 17 IU/L (ref 0–32)
AST: 18 IU/L (ref 0–40)
Albumin/Globulin Ratio: 1 — ABNORMAL LOW (ref 1.2–2.2)
BUN / CREAT RATIO: 18 (ref 9–23)
BUN: 17 mg/dL (ref 6–24)
Bilirubin Total: 0.3 mg/dL (ref 0.0–1.2)
CALCIUM: 9.8 mg/dL (ref 8.7–10.2)
CO2: 24 mmol/L (ref 20–29)
CREATININE: 0.94 mg/dL (ref 0.57–1.00)
Chloride: 99 mmol/L (ref 96–106)
GFR calc Af Amer: 82 mL/min/{1.73_m2} (ref >59)
GFR calc non Af Amer: 71 mL/min/{1.73_m2} (ref >59)
GLUCOSE: 99 mg/dL (ref 65–99)
Globulin, Total: 3.8 g/dL (ref 1.5–4.5)
Potassium: 4.2 mmol/L (ref 3.5–5.2)
Sodium: 139 mmol/L (ref 134–144)
Total Protein: 7.7 g/dL (ref 6.0–8.5)

## 2017-01-08 LAB — LIPID PANEL
CHOLESTEROL TOTAL: 165 mg/dL (ref 100–199)
Chol/HDL Ratio: 3.4 ratio (ref 0.0–4.4)
HDL: 48 mg/dL (ref >39)
LDL Calculated: 105 mg/dL — ABNORMAL HIGH (ref 0–99)
Triglycerides: 58 mg/dL (ref 0–149)
VLDL Cholesterol Cal: 12 mg/dL (ref 5–40)

## 2017-01-08 LAB — HEMOGLOBIN A1C
ESTIMATED AVERAGE GLUCOSE: 137 mg/dL
HEMOGLOBIN A1C: 6.4 % — AB (ref 4.8–5.6)

## 2017-02-14 ENCOUNTER — Other Ambulatory Visit: Payer: Self-pay | Admitting: Physician Assistant

## 2017-02-14 DIAGNOSIS — E119 Type 2 diabetes mellitus without complications: Secondary | ICD-10-CM

## 2017-03-15 ENCOUNTER — Other Ambulatory Visit: Payer: Self-pay | Admitting: Physician Assistant

## 2017-03-15 ENCOUNTER — Encounter: Payer: Self-pay | Admitting: Physician Assistant

## 2017-07-12 ENCOUNTER — Other Ambulatory Visit: Payer: Self-pay | Admitting: Physician Assistant

## 2017-07-12 DIAGNOSIS — I10 Essential (primary) hypertension: Secondary | ICD-10-CM

## 2017-07-25 ENCOUNTER — Other Ambulatory Visit: Payer: Self-pay

## 2017-07-25 ENCOUNTER — Ambulatory Visit: Payer: BC Managed Care – PPO | Admitting: Physician Assistant

## 2017-07-25 ENCOUNTER — Encounter: Payer: Self-pay | Admitting: Gastroenterology

## 2017-07-25 ENCOUNTER — Encounter: Payer: Self-pay | Admitting: Physician Assistant

## 2017-07-25 ENCOUNTER — Ambulatory Visit (INDEPENDENT_AMBULATORY_CARE_PROVIDER_SITE_OTHER): Payer: BC Managed Care – PPO

## 2017-07-25 VITALS — BP 133/88 | HR 103 | Temp 98.6°F | Resp 20 | Ht 61.02 in | Wt 265.8 lb

## 2017-07-25 DIAGNOSIS — E119 Type 2 diabetes mellitus without complications: Secondary | ICD-10-CM | POA: Diagnosis not present

## 2017-07-25 DIAGNOSIS — D649 Anemia, unspecified: Secondary | ICD-10-CM

## 2017-07-25 DIAGNOSIS — I10 Essential (primary) hypertension: Secondary | ICD-10-CM

## 2017-07-25 DIAGNOSIS — M79675 Pain in left toe(s): Secondary | ICD-10-CM

## 2017-07-25 LAB — POCT CBC
Granulocyte percent: 44.8 %G (ref 37–80)
HCT, POC: 35.7 % — AB (ref 37.7–47.9)
Hemoglobin: 10.7 g/dL — AB (ref 12.2–16.2)
LYMPH, POC: 3.7 — AB (ref 0.6–3.4)
MCH, POC: 23.7 pg — AB (ref 27–31.2)
MCHC: 30 g/dL — AB (ref 31.8–35.4)
MCV: 79 fL — AB (ref 80–97)
MID (CBC): 0.8 (ref 0–0.9)
MPV: 7.1 fL (ref 0–99.8)
PLATELET COUNT, POC: 408 10*3/uL (ref 142–424)
POC Granulocyte: 3.6 (ref 2–6.9)
POC LYMPH %: 45.2 % (ref 10–50)
POC MID %: 10 % (ref 0–12)
RBC: 4.52 M/uL (ref 4.04–5.48)
RDW, POC: 16.2 %
WBC: 8.1 10*3/uL (ref 4.6–10.2)

## 2017-07-25 LAB — POCT GLYCOSYLATED HEMOGLOBIN (HGB A1C): Hemoglobin A1C: 6.6 % — AB (ref 4.0–5.6)

## 2017-07-25 MED ORDER — LISINOPRIL 20 MG PO TABS: 20.0000 mg | ORAL_TABLET | Freq: Every day | ORAL | 1 refills | Status: DC

## 2017-07-25 MED ORDER — AMLODIPINE BESYLATE 5 MG PO TABS: 5.0000 mg | ORAL_TABLET | Freq: Every day | ORAL | 1 refills | Status: DC

## 2017-07-25 MED ORDER — INDOMETHACIN 50 MG PO CAPS: ORAL_CAPSULE | ORAL | 1 refills | Status: AC

## 2017-07-25 MED ORDER — METFORMIN HCL ER 750 MG PO TB24: 750.0000 mg | ORAL_TABLET | Freq: Every day | ORAL | 1 refills | Status: DC

## 2017-07-25 NOTE — Patient Instructions (Addendum)
Your A1c has increased to 6.6.  You can still continue with metformin at the current dose but if your A1c goes any higher, we will need to increase this dose.  Try to focus on low carbohydrate and sweet intake.  Below is beneficial diabetes and nutrition.  You do not have elevated white blood cell count.  Your red blood cell count was slightly low.  We should recheck this in about 1 week to ensure that this is stable.  Please return for lab only visit at that time.   Stop hydrochlorothiazide.  Continue with lisinopril.  Start amlodipine.  For suspected gout, below are foods to avoid. You may take indomethacin for the next few days. In the future, if you have a flare I have given you info on how to take the medication.   Follow up in office in 3 months.   Diabetes Mellitus and Nutrition When you have diabetes (diabetes mellitus), it is very important to have healthy eating habits because your blood sugar (glucose) levels are greatly affected by what you eat and drink. Eating healthy foods in the appropriate amounts, at about the same times every day, can help you:  Control your blood glucose.  Lower your risk of heart disease.  Improve your blood pressure.  Reach or maintain a healthy weight.  Every person with diabetes is different, and each person has different needs for a meal plan. Your health care provider may recommend that you work with a diet and nutrition specialist (dietitian) to make a meal plan that is best for you. Your meal plan may vary depending on factors such as:  The calories you need.  The medicines you take.  Your weight.  Your blood glucose, blood pressure, and cholesterol levels.  Your activity level.  Other health conditions you have, such as heart or kidney disease.  How do carbohydrates affect me? Carbohydrates affect your blood glucose level more than any other type of food. Eating carbohydrates naturally increases the amount of glucose in your blood.  Carbohydrate counting is a method for keeping track of how many carbohydrates you eat. Counting carbohydrates is important to keep your blood glucose at a healthy level, especially if you use insulin or take certain oral diabetes medicines. It is important to know how many carbohydrates you can safely have in each meal. This is different for every person. Your dietitian can help you calculate how many carbohydrates you should have at each meal and for snack. Foods that contain carbohydrates include:  Bread, cereal, rice, pasta, and crackers.  Potatoes and corn.  Peas, beans, and lentils.  Milk and yogurt.  Fruit and juice.  Desserts, such as cakes, cookies, ice cream, and candy.  How does alcohol affect me? Alcohol can cause a sudden decrease in blood glucose (hypoglycemia), especially if you use insulin or take certain oral diabetes medicines. Hypoglycemia can be a life-threatening condition. Symptoms of hypoglycemia (sleepiness, dizziness, and confusion) are similar to symptoms of having too much alcohol. If your health care provider says that alcohol is safe for you, follow these guidelines:  Limit alcohol intake to no more than 1 drink per day for nonpregnant women and 2 drinks per day for men. One drink equals 12 oz of beer, 5 oz of wine, or 1 oz of hard liquor.  Do not drink on an empty stomach.  Keep yourself hydrated with water, diet soda, or unsweetened iced tea.  Keep in mind that regular soda, juice, and other mixers may contain  a lot of sugar and must be counted as carbohydrates.  What are tips for following this plan? Reading food labels  Start by checking the serving size on the label. The amount of calories, carbohydrates, fats, and other nutrients listed on the label are based on one serving of the food. Many foods contain more than one serving per package.  Check the total grams (g) of carbohydrates in one serving. You can calculate the number of servings of  carbohydrates in one serving by dividing the total carbohydrates by 15. For example, if a food has 30 g of total carbohydrates, it would be equal to 2 servings of carbohydrates.  Check the number of grams (g) of saturated and trans fats in one serving. Choose foods that have low or no amount of these fats.  Check the number of milligrams (mg) of sodium in one serving. Most people should limit total sodium intake to less than 2,300 mg per day.  Always check the nutrition information of foods labeled as "low-fat" or "nonfat". These foods may be higher in added sugar or refined carbohydrates and should be avoided.  Talk to your dietitian to identify your daily goals for nutrients listed on the label. Shopping  Avoid buying canned, premade, or processed foods. These foods tend to be high in fat, sodium, and added sugar.  Shop around the outside edge of the grocery store. This includes fresh fruits and vegetables, bulk grains, fresh meats, and fresh dairy. Cooking  Use low-heat cooking methods, such as baking, instead of high-heat cooking methods like deep frying.  Cook using healthy oils, such as olive, canola, or sunflower oil.  Avoid cooking with butter, cream, or high-fat meats. Meal planning  Eat meals and snacks regularly, preferably at the same times every day. Avoid going long periods of time without eating.  Eat foods high in fiber, such as fresh fruits, vegetables, beans, and whole grains. Talk to your dietitian about how many servings of carbohydrates you can eat at each meal.  Eat 4-6 ounces of lean protein each day, such as lean meat, chicken, fish, eggs, or tofu. 1 ounce is equal to 1 ounce of meat, chicken, or fish, 1 egg, or 1/4 cup of tofu.  Eat some foods each day that contain healthy fats, such as avocado, nuts, seeds, and fish. Lifestyle   Check your blood glucose regularly.  Exercise at least 30 minutes 5 or more days each week, or as told by your health care  provider.  Take medicines as told by your health care provider.  Do not use any products that contain nicotine or tobacco, such as cigarettes and e-cigarettes. If you need help quitting, ask your health care provider.  Work with a Veterinary surgeon or diabetes educator to identify strategies to manage stress and any emotional and social challenges. What are some questions to ask my health care provider?  Do I need to meet with a diabetes educator?  Do I need to meet with a dietitian?  What number can I call if I have questions?  When are the best times to check my blood glucose? Where to find more information:  American Diabetes Association: diabetes.org/food-and-fitness/food  Academy of Nutrition and Dietetics: https://www.vargas.com/  [employer] of Diabetes and Digestive and Kidney Diseases (NIH): FindJewelers.cz Summary  A healthy meal plan will help you control your blood glucose and maintain a healthy lifestyle.  Working with a diet and nutrition specialist (dietitian) can help you make a meal plan that is best for  you.  Keep in mind that carbohydrates and alcohol have immediate effects on your blood glucose levels. It is important to count carbohydrates and to use alcohol carefully. This information is not intended to replace advice given to you by your health care provider. Make sure you discuss any questions you have with your health care provider. Document Released: 09/24/2004 Document Revised: 02/02/2016 Document Reviewed: 02/02/2016 Elsevier Interactive Patient Education  2018 [employer].  Low-Purine Diet Purines are compounds that affect the level of uric acid in your body. A low-purine diet is a diet that is low in purines. Eating a low-purine diet can prevent the level of uric acid in your body from getting too high and causing gout or kidney stones or  both. What do I need to know about this diet?  Choose low-purine foods. Examples of low-purine foods are listed in the next section.  Drink plenty of fluids, especially water. Fluids can help remove uric acid from your body. Try to drink 8-16 cups (1.9-3.8 L) a day.  Limit foods high in fat, especially saturated fat, as fat makes it harder for the body to get rid of uric acid. Foods high in saturated fat include pizza, cheese, ice cream, whole milk, fried foods, and gravies. Choose foods that are lower in fat and lean sources of protein. Use olive oil when cooking as it contains healthy fats that are not high in saturated fat.  Limit alcohol. Alcohol interferes with the elimination of uric acid from your body. If you are having a gout attack, avoid all alcohol.  Keep in mind that different people's bodies react differently to different foods. You will probably learn over time which foods do or do not affect you. If you discover that a food tends to cause your gout to flare up, avoid eating that food. You can more freely enjoy foods that do not cause problems. If you have any questions about a food item, talk to your dietitian or health care provider. Which foods are low, moderate, and high in purines? The following is a list of foods that are low, moderate, and high in purines. You can eat any amount of the foods that are low in purines. You may be able to have small amounts of foods that are moderate in purines. Ask your health care provider how much of a food moderate in purines you can have. Avoid foods high in purines. Grains  Foods low in purines: Enriched white bread, pasta, rice, cake, cornbread, popcorn.  Foods moderate in purines: Whole-grain breads and cereals, wheat germ, bran, oatmeal. Uncooked oatmeal. Dry wheat bran or wheat germ.  Foods high in purines: Pancakes, [place] toast, biscuits, muffins. Vegetables  Foods low in purines: All vegetables, except those that are moderate in  purines.  Foods moderate in purines: Asparagus, cauliflower, spinach, mushrooms, green peas. Fruits  All fruits are low in purines. Meats and other Protein Foods  Foods low in purines: Eggs, nuts, peanut butter.  Foods moderate in purines: 80-90% lean beef, lamb, veal, pork, poultry, fish, eggs, peanut butter, nuts. Crab, lobster, oysters, and shrimp. Cooked dried beans, peas, and lentils.  Foods high in purines: Anchovies, sardines, herring, mussels, tuna, codfish, scallops, trout, and haddock. Victoria Hansen. Organ meats (such as liver or kidney). Tripe. Game meat. Goose. Sweetbreads. Dairy  All dairy foods are low in purines. Low-fat and fat-free dairy products are best because they are low in saturated fat. Beverages  Drinks low in purines: Water, carbonated beverages, tea, coffee, cocoa.  Drinks moderate in purines: Soft drinks and other drinks sweetened with high-fructose corn syrup. Juices. To find whether a food or drink is sweetened with high-fructose corn syrup, look at the ingredients list.  Drinks high in purines: Alcoholic beverages (such as beer). Condiments  Foods low in purines: Salt, herbs, olives, pickles, relishes, vinegar.  Foods moderate in purines: Butter, margarine, oils, mayonnaise. Fats and Oils  Foods low in purines: All types, except gravies and sauces made with meat.  Foods high in purines: Gravies and sauces made with meat. Other Foods  Foods low in purines: Sugars, sweets, gelatin. Cake. Soups made without meat.  Foods moderate in purines: Meat-based or fish-based soups, broths, or bouillons. Foods and drinks sweetened with high-fructose corn syrup.  Foods high in purines: High-fat desserts (such as ice cream, cookies, cakes, pies, doughnuts, and chocolate). Contact your dietitian for more information on foods that are not listed here. This information is not intended to replace advice given to you by your health care provider. Make sure you discuss any  questions you have with your health care provider. Document Released: 04/24/2010 Document Revised: 06/05/2015 Document Reviewed: 12/04/2012 Elsevier Interactive Patient Education  2017 [employer]    IF you received an x-ray today, you will [facility] an [facility] from [facility]. Please contact [facility] at [phone] with questions or concerns regarding your invoice.   IF you received labwork today, you will receive an invoice from [facility]. Please contact [facility] at [phone] with questions or concerns regarding your invoice.   Our billing staff will not be able to assist you with questions regarding bills from these companies.  You will be contacted with the lab results as soon as they are available. The fastest way to get your results is to activate your My Chart account. Instructions are located on the last page of this paperwork. If you have not heard from us regarding the results in 2 weeks, please contact this office.

## 2017-07-25 NOTE — Progress Notes (Signed)
MRN: [id number]  Subjective:   Victoria Hansen is a 52 y.o. female who presents for follow up of Type 2 diabetes mellitus, HTN, and left foot pain.   1) T2DM: Diagnosis was madein 2017. Patient is currently managed withmetformin XR 777m once daily. . Denies adverse effects including metallic taste, hypoglycemia, nausea, vomiting. Patientischecking home blood sugars occassionally. Home blood sugar records: BGs consistently in an acceptable range(low 90s). Current symptoms includenone. Patient deniesfoot ulcerations, hyperglycemia, nausea, paresthesia of the feet, polydipsia, polyuria, visual disturbances and vomiting. Patientischecking their feet daily. Nofoot concerns. [facility]. Diet: Limiting sodas and sweet teas but has not been as "good as I used to be." Trying to eat more at home, eating more vegetables.. Exercise includes more walking, esp since summer is here.Denies smoking or alcohol use.Known diabetic complications:none.Immunizations: Flu vaccine: 02/2016, pneumococal vaccine: 06/2016. Taking lipitor 458msince 03/2016. Has been taking that consistently. No issues with medication.   2)HTN:Currently managed withlisinopril 2056mnd HCTZ 12.5mg59matientischecking blood pressure at home, range is 130s676Ptolic. She uses a wrist monitor and has not brought it in to compare to our values.Denies lightheadedness, dizziness, chronic headache, double vision, chest pain, shortness of breath, heart racing, palpitations, nausea, vomiting, abdominal pain, hematuria, and worsening lower leg swelling.  3) Left foot pain x 1 week. Felt a twinge in the big toe joint one day. The next day woke up redder, swollen, and very painful to the touch. No acute injury. Denies fever, chills, NVD. Light touch even irritated it. Could not use covers. Took ibuprofen for pain and it helped a lot and drank lots of water. Did go to a pancake house  during vacation prior to this. No PMH of gout. Notes it is much better since it started.    Past Medical History:  Diagnosis Date  . Diabetes mellitus without complication (HCC)[place]. Hypertension    Family History  Problem Relation Age of Onset  . Hypertension Mother   . Thyroid disease Mother   . Heart disease Father   . Hypertension Father   . Gout Father   . Gout Brother    Social History   Socioeconomic History  . Marital status: Married    Spouse name: Not on file  . Number of children: 2  . Years of education: Not on file  . Highest education level: Not on file  Occupational History  . Not on file  Social Needs  . Financial resource strain: Not on file  . Food insecurity:    Worry: Not on file    Inability: Not on file  . Transportation needs:    Medical: Not on file    Non-medical: Not on file  Tobacco Use  . Smoking status: Never Smoker  . Smokeless tobacco: Never Used  Substance and Sexual Activity  . Alcohol use: Yes    Comment: occ  . Drug use: No  . Sexual activity: Yes  Lifestyle  . Physical activity:    Days per week: Not on file    Minutes per session: Not on file  . Stress: Not on file  Relationships  . Social connections:    Talks on phone: Not on file    Gets together: Not on file    Attends religious service: Not on file    Active member of club or organization: Not on file    Attends meetings of clubs or organizations: Not on  file    Relationship status: Not on file  . Intimate partner violence:    Fear of current or ex partner: Not on file    Emotionally abused: Not on file    Physically abused: Not on file    Forced sexual activity: Not on file  Other Topics Concern  . Not on file  Social History Narrative  . Not on file   Review of Systems  Constitutional: Negative for weight loss.  Respiratory: Negative for cough, shortness of breath and wheezing.   Cardiovascular: Negative for chest pain and palpitations.  Gastrointestinal:  Negative for abdominal pain, blood in stool, melena, nausea and vomiting.  Musculoskeletal: Negative for back pain and neck pain.  Skin: Negative for rash.  Neurological: Negative for dizziness, tingling and headaches.  Endo/Heme/Allergies: Does not bruise/bleed easily.      Objective:   PHYSICAL EXAM BP 133/88 (BP Location: Right Arm, Patient Position: Sitting, Cuff Size: Large)   Pulse (!) 103   Temp 98.6 F (37 C) (Oral)   Resp 20   Ht 5' 1.02" (1.55 m)   Wt 265 lb 12.8 oz (120.6 kg)   LMP 10/26/2015 (Exact Date)   SpO2 95%   BMI 50.18 kg/m   Physical Exam  Constitutional: She is oriented to person, place, and time. She appears well-developed and well-nourished. No distress.  HENT:  Head: Normocephalic and atraumatic.  Mouth/Throat: Uvula is midline, oropharynx is clear and moist and mucous membranes are normal.  Eyes: Pupils are equal, round, and reactive to light. Conjunctivae and EOM are normal.  Neck: Normal range of motion.  Cardiovascular: Normal rate, regular rhythm, normal heart sounds and intact distal pulses.  Pulmonary/Chest: Effort normal and breath sounds normal. She has no decreased breath sounds. She has no wheezes. She has no rhonchi. She has no rales.  Musculoskeletal:       Right lower leg: She exhibits no swelling.       Left lower leg: She exhibits no swelling.       Left foot: There is decreased range of motion, tenderness (mild TTP at 1st MTP) and swelling (mild swelling at 1st MTP with minimal overlying warmth, no erythema). There is no bony tenderness, normal capillary refill and no laceration.  Feet:  Right Foot:  Protective Sensation: 4 sites tested. 4 sites sensed.  Skin Integrity: Positive for dry skin.  Left Foot:  Protective Sensation: 4 sites tested. 4 sites sensed.  Skin Integrity: Positive for dry skin.  Neurological: She is alert and oriented to person, place, and time.  Skin: Skin is warm and dry.  Psychiatric: She has a normal mood  and affect.  Vitals reviewed.   Diabetic Foot Exam - Simple   Simple Foot Form Visual Inspection No deformities, no ulcerations, no other skin breakdown bilaterally:  Yes Sensation Testing Intact to touch and monofilament testing bilaterally:  Yes Pulse Check Posterior Tibialis and Dorsalis pulse intact bilaterally:  Yes Comments     Results for orders placed or performed in visit on 07/25/17 (from the past 24 hour(s))  POCT CBC     Status: Abnormal   Collection Time: 07/25/17  4:35 PM  Result Value Ref Range   WBC 8.1 4.6 - 10.2 K/uL   Lymph, poc 3.7 (A) 0.6 - 3.4   POC LYMPH PERCENT 45.2 10 - 50 %L   MID (cbc) 0.8 0 - 0.9   POC MID % 10.0 0 - 12 %M   POC Granulocyte 3.6 2 - 6.9  Granulocyte percent 44.8 37 - 80 %G   RBC 4.52 4.04 - 5.48 M/uL   Hemoglobin 10.7 (A) 12.2 - 16.2 g/dL   HCT, POC 35.7 (A) 37.7 - 47.9 %   MCV 79.0 (A) 80 - 97 fL   MCH, POC 23.7 (A) 27 - 31.2 pg   MCHC 30.0 (A) 31.8 - 35.4 g/dL   RDW, POC 16.2 %   Platelet Count, POC 408 142 - 424 K/uL   MPV 7.1 0 - 99.8 fL  POCT glycosylated hemoglobin (Hb A1C)     Status: Abnormal   Collection Time: 07/25/17  4:39 PM  Result Value Ref Range   Hemoglobin A1C 6.6 (A) 4.0 - 5.6 %   HbA1c POC (<> result, manual entry)  4.0 - 5.6 %   HbA1c, POC (prediabetic range)  5.7 - 6.4 %   HbA1c, POC (controlled diabetic range)  0.0 - 7.0 %    Dg Toe Great Left  Result Date: 07/25/2017 CLINICAL DATA:  Swelling for 1 week at MTP joint question gout EXAM: LEFT GREAT TOE COMPARISON:  None FINDINGS: Osseous mineralization grossly normal for technique. Joint spaces preserved. No acute fracture, dislocation, or bone destruction. No erosive or inflammatory changes. IMPRESSION: Normal exam. Electronically Signed   By: Lavonia Dana M.D.   On: 07/25/2017 16:04   Wt Readings from Last 3 Encounters:  07/25/17 265 lb 12.8 oz (120.6 kg)  01/07/17 262 lb 12.8 oz (119.2 kg)  10/06/16 264 lb (119.7 kg)   BP Readings from Last 3  Encounters:  07/25/17 133/88  01/07/17 138/87  10/06/16 (!) 148/84    Assessment and Plan :  1. Pain of toe of left foot Hx suspicious for gout. Do not suspect underlying bacterial etiology. WBC wnl. Afebrile. Pain has sig improved with NSAIDs. Labs pending.  - DG Toe Great Left; Future - Uric Acid - POCT CBC - indomethacin (INDOCIN) 50 MG capsule; 50 mg 3 times daily; initiate within 24 to 48 hours of flare onset preferably; discontinue 2 to 3 days after resolution of clinical signs; usual duration: 5 to 7 days  Dispense: 30 capsule; Refill: 1  2. Type 2 diabetes mellitus without complication, without long-term current use of insulin (HCC) A1C has slightly increased. Still <7.0. Discussed lifestyle modifications. Follow up in 3 months.  - CMP14+EGFR - Lipid panel - TSH - Urinalysis, dipstick only - Microalbumin/Creatinine Ratio, Urine - HM Diabetes Foot Exam - metFORMIN (GLUCOPHAGE-XR) 750 MG 24 hr tablet; Take 1 tablet (750 mg total) by mouth daily with breakfast.  Dispense: 90 tablet; Refill: 1 - POCT glycosylated hemoglobin (Hb A1C)  3. Essential hypertension D/c HCTZ, cont lisinipril, start amlodipine.  - Urinalysis, dipstick only - amLODipine (NORVASC) 5 MG tablet; Take 1 tablet (5 mg total) by mouth daily.  Dispense: 90 tablet; Refill: 1 - lisinopril (PRINIVIL,ZESTRIL) 20 MG tablet; Take 1 tablet (20 mg total) by mouth daily.  Dispense: 90 tablet; Refill: 1  4. Anemia, unspecified type Follow up in one week for repeat labs. Colonoscopy scheduled for 09/2017.  - CBC with Differential/Platelet; Future - Iron, TIBC and Ferritin Panel; Future  A total of 40 minutes was spent in the room with the patient, greater than 50% of which was in counseling/coordination of care regarding chronic medical conditions and acute pain of left foot.  Tenna Delaine, PA-C  [facility] 07/25/2017 4:44 PM

## 2017-07-26 LAB — TSH: TSH: 1.16 u[IU]/mL (ref 0.450–4.500)

## 2017-07-26 LAB — CMP14+EGFR
A/G RATIO: 1.1 — AB (ref 1.2–2.2)
ALT: 14 IU/L (ref 0–32)
AST: 16 IU/L (ref 0–40)
Albumin: 3.9 g/dL (ref 3.5–5.5)
Alkaline Phosphatase: 107 IU/L (ref 39–117)
BUN/Creatinine Ratio: 15 (ref 9–23)
BUN: 14 mg/dL (ref 6–24)
Bilirubin Total: 0.3 mg/dL (ref 0.0–1.2)
CO2: 28 mmol/L (ref 20–29)
CREATININE: 0.94 mg/dL (ref 0.57–1.00)
Calcium: 10.2 mg/dL (ref 8.7–10.2)
Chloride: 99 mmol/L (ref 96–106)
GFR calc Af Amer: 82 mL/min/{1.73_m2} (ref >59)
GFR, EST NON AFRICAN AMERICAN: 71 mL/min/{1.73_m2} (ref >59)
GLUCOSE: 120 mg/dL — AB (ref 65–99)
Globulin, Total: 3.7 g/dL (ref 1.5–4.5)
POTASSIUM: 4.4 mmol/L (ref 3.5–5.2)
Sodium: 141 mmol/L (ref 134–144)
TOTAL PROTEIN: 7.6 g/dL (ref 6.0–8.5)

## 2017-07-26 LAB — MICROALBUMIN / CREATININE URINE RATIO
CREATININE, UR: 145.7 mg/dL
Microalb/Creat Ratio: 6.7 mg/g creat (ref 0.0–30.0)
Microalbumin, Urine: 9.8 ug/mL

## 2017-07-26 LAB — URINALYSIS, DIPSTICK ONLY
BILIRUBIN UA: NEGATIVE
GLUCOSE, UA: NEGATIVE
Ketones, UA: NEGATIVE
LEUKOCYTES UA: NEGATIVE
Nitrite, UA: NEGATIVE
PROTEIN UA: NEGATIVE
RBC UA: NEGATIVE
SPEC GRAV UA: 1.019 (ref 1.005–1.030)
Urobilinogen, Ur: 0.2 mg/dL (ref 0.2–1.0)
pH, UA: 6 (ref 5.0–7.5)

## 2017-07-26 LAB — LIPID PANEL
CHOL/HDL RATIO: 3.7 ratio (ref 0.0–4.4)
Cholesterol, Total: 151 mg/dL (ref 100–199)
HDL: 41 mg/dL (ref >39)
LDL CALC: 97 mg/dL (ref 0–99)
Triglycerides: 66 mg/dL (ref 0–149)
VLDL Cholesterol Cal: 13 mg/dL (ref 5–40)

## 2017-07-26 LAB — URIC ACID: Uric Acid: 9.8 mg/dL — ABNORMAL HIGH (ref 2.5–7.1)

## 2017-07-27 ENCOUNTER — Ambulatory Visit: Payer: BC Managed Care – PPO | Admitting: Physician Assistant

## 2017-08-04 ENCOUNTER — Ambulatory Visit: Payer: BC Managed Care – PPO

## 2017-08-04 DIAGNOSIS — D649 Anemia, unspecified: Secondary | ICD-10-CM

## 2017-08-05 LAB — CBC WITH DIFFERENTIAL/PLATELET
Basophils Absolute: 0.1 10*3/uL (ref 0.0–0.2)
Basos: 1 %
EOS (ABSOLUTE): 0.2 10*3/uL (ref 0.0–0.4)
Eos: 3 %
HEMOGLOBIN: 11.5 g/dL (ref 11.1–15.9)
Hematocrit: 36.1 % (ref 34.0–46.6)
Immature Grans (Abs): 0 10*3/uL (ref 0.0–0.1)
Immature Granulocytes: 0 %
LYMPHS ABS: 3.1 10*3/uL (ref 0.7–3.1)
LYMPHS: 49 %
MCH: 26.7 pg (ref 26.6–33.0)
MCHC: 31.9 g/dL (ref 31.5–35.7)
MCV: 84 fL (ref 79–97)
MONOCYTES: 9 %
Monocytes Absolute: 0.6 10*3/uL (ref 0.1–0.9)
Neutrophils Absolute: 2.4 10*3/uL (ref 1.4–7.0)
Neutrophils: 38 %
PLATELETS: 360 10*3/uL (ref 150–450)
RBC: 4.31 x10E6/uL (ref 3.77–5.28)
RDW: 15.1 % (ref 12.3–15.4)
WBC: 6.3 10*3/uL (ref 3.4–10.8)

## 2017-08-05 LAB — IRON,TIBC AND FERRITIN PANEL
FERRITIN: 85 ng/mL (ref 15–150)
IRON SATURATION: 19 % (ref 15–55)
Iron: 55 ug/dL (ref 27–159)
Total Iron Binding Capacity: 293 ug/dL (ref 250–450)
UIBC: 238 ug/dL (ref 131–425)

## 2017-08-10 ENCOUNTER — Encounter: Payer: Self-pay | Admitting: *Deleted

## 2017-09-14 ENCOUNTER — Telehealth: Payer: Self-pay | Admitting: *Deleted

## 2017-09-14 NOTE — Telephone Encounter (Signed)
Dr Myrtie Neither,  This pt is scheduled for a direct screening colon with you 9-23 Monday with a PV 9-10.  She has no GI hx.  She has a hx of DM, HTN and HLD.   Her BMI is 50.18.  Do you want her to have an OV or can she be direct at the hospital?  Please advise and thanks for your time,  Hilda Lias

## 2017-09-14 NOTE — Telephone Encounter (Signed)
She can be directly booked for my hospital outpatient block at [facility] in Nov or Dec.  Pls do not use an October slot (if any remaining) for a routine colon, as I will need them for more urgent office-based procedures.

## 2017-09-14 NOTE — Telephone Encounter (Signed)
Raynelle Fanning,  Can you schedule this at [facility] per Dr Myrtie Neither below let us know when it's rescheduled for in PV 51.  Thanks, Hilda Lias

## 2017-09-15 NOTE — Telephone Encounter (Signed)
Left message for patient to call back, we will need to cancel her colonoscopy and pre-visit here. Will need to be scheduled at [facility], due to anesthesia guidelines, in November or December. I have placed a recall in system to contact patient when that schedule is available. She will also need to have another pre-visit scheduled closer to the time of the procedure.

## 2017-09-16 NOTE — Telephone Encounter (Signed)
Spoke to patient let her know that her procedure will need to be done at the hospital, she prefers November. I told her that we will contact her when that schedule is available.

## 2017-10-03 ENCOUNTER — Encounter: Payer: BC Managed Care – PPO | Admitting: Gastroenterology

## 2017-10-07 ENCOUNTER — Telehealth: Payer: Self-pay

## 2017-10-07 NOTE — Telephone Encounter (Signed)
Left message for patient to call back, we have the November schedule for the hospital. Dr. Myrtie Neither' day that month is 11/5. Asked her to call back to see if that will work for her, she will also need a pre-visit appointment.

## 2017-10-19 ENCOUNTER — Other Ambulatory Visit: Payer: Self-pay

## 2017-10-19 DIAGNOSIS — Z1211 Encounter for screening for malignant neoplasm of colon: Secondary | ICD-10-CM

## 2017-10-27 ENCOUNTER — Ambulatory Visit: Payer: BC Managed Care – PPO | Admitting: Physician Assistant

## 2017-11-02 ENCOUNTER — Telehealth: Payer: Self-pay | Admitting: Gastroenterology

## 2017-11-02 NOTE — Telephone Encounter (Signed)
FYI, will contact the hospital to cancel her screening colonoscopy.

## 2017-11-02 NOTE — Telephone Encounter (Signed)
Pt called to cancel colon scheduled on 11/15/17 at [facility] hosp. She will call back next summer to r/s.

## 2017-11-02 NOTE — Telephone Encounter (Signed)
Victoria Hansen,    This patient's hospital slot is now open. Please offer it to one of the multiple folks waiting for such slots.  - HD

## 2017-11-05 ENCOUNTER — Encounter: Payer: Self-pay | Admitting: Physician Assistant

## 2017-11-05 ENCOUNTER — Ambulatory Visit (INDEPENDENT_AMBULATORY_CARE_PROVIDER_SITE_OTHER): Payer: BC Managed Care – PPO | Admitting: Physician Assistant

## 2017-11-05 ENCOUNTER — Other Ambulatory Visit: Payer: Self-pay

## 2017-11-05 VITALS — BP 121/81

## 2017-11-05 DIAGNOSIS — E119 Type 2 diabetes mellitus without complications: Secondary | ICD-10-CM

## 2017-11-05 DIAGNOSIS — I10 Essential (primary) hypertension: Secondary | ICD-10-CM | POA: Diagnosis not present

## 2017-11-05 DIAGNOSIS — Z23 Encounter for immunization: Secondary | ICD-10-CM

## 2017-11-05 LAB — POCT GLYCOSYLATED HEMOGLOBIN (HGB A1C): Hemoglobin A1C: 6.5 % — AB (ref 4.0–5.6)

## 2017-11-05 MED ORDER — LISINOPRIL 20 MG PO TABS: 20.0000 mg | ORAL_TABLET | Freq: Every day | ORAL | 1 refills | Status: DC

## 2017-11-05 MED ORDER — AMLODIPINE BESYLATE 5 MG PO TABS: 5.0000 mg | ORAL_TABLET | Freq: Every day | ORAL | 1 refills | Status: DC

## 2017-11-05 MED ORDER — METFORMIN HCL ER 750 MG PO TB24: 750.0000 mg | ORAL_TABLET | Freq: Every day | ORAL | 1 refills | Status: DC

## 2017-11-05 NOTE — Progress Notes (Signed)
MRN: [id number]  Subjective:   Victoria Hansen is a 52 y.o. female who presents for follow up of Type 2 diabetes mellitus and HTN. Last seen 07/2017.  T2DM: Diagnosis was made 2017. Patient is currently managed with Continued metformin 750mg  daily which has been effective. Admits excellent compliance. Denies adverse effects including metallic taste, hypoglycemia, nausea, vomiting. Patient is checking home blood sugars. Home blood sugar records: 90s. Current symptoms include none. Patient denies foot ulcerations, nausea, paresthesia of the feet, polydipsia, polyuria, visual disturbances and vomiting. Patient is checking their feet daily. No foot concerns. Last diabetic eye exam eye exam 05/2016 with [facility], normal.  Diet: continues to watch what she eats and focus on healthy options. Still walking frequently. Denies smoking or alcohol use.Known diabetic complications:none.Immunizations: Flu vaccine: 02/2016, pneumococal vaccine:06/2016. Taking lipitor 40mg  since 03/2016. Has been taking that consistently. No issues with medication.  -WUJ:WJXBJYNWG managed withlisinopril 20mg and amlodipine 5mg  daily.Denies lightheadedness, dizziness, chronic headache, double vision, chest pain, shortness of breath, heart racing, palpitations, nausea, vomiting, abdominal pain, hematuria,and worseninglower leg swelling.    Past Medical History:  Diagnosis Date  . Diabetes mellitus without complication (HCC)   . Hypertension    Social History   Socioeconomic History  . Marital status: Married    Spouse name: Not on file  . Number of children: 2  . Years of education: Not on file  . Highest education level: Not on file  Occupational History  . Not on file  Social Needs  . Financial resource strain: Not on file  . Food insecurity:    Worry: Not on file    Inability: Not on file  . Transportation needs:    Medical: Not on file    Non-medical: Not on file  Tobacco Use  . Smoking  status: Never Smoker  . Smokeless tobacco: Never Used  Substance and Sexual Activity  . Alcohol use: Yes    Comment: occ  . Drug use: No  . Sexual activity: Yes  Lifestyle  . Physical activity:    Days per week: Not on file    Minutes per session: Not on file  . Stress: Not on file  Relationships  . Social connections:    Talks on phone: Not on file    Gets together: Not on file    Attends religious service: Not on file    Active member of club or organization: Not on file    Attends meetings of clubs or organizations: Not on file    Relationship status: Not on file  . Intimate partner violence:    Fear of current or ex partner: Not on file    Emotionally abused: Not on file    Physically abused: Not on file    Forced sexual activity: Not on file  Other Topics Concern  . Not on file  Social History Narrative  . Not on file    Objective:   PHYSICAL EXAM BP 121/81 (BP Location: Left Arm, Patient Position: Sitting, Cuff Size: Large) Comment (Cuff Size): xl cuff  LMP 10/26/2015 (Exact Date)   Physical Exam  Constitutional: She is oriented to person, place, and time. She appears well-developed and well-nourished. No distress.  HENT:  Head: Normocephalic and atraumatic.  Mouth/Throat: Uvula is midline, oropharynx is clear and moist and mucous membranes are normal.  Eyes: Pupils are equal, round, and reactive to light. Conjunctivae and EOM are normal.  Neck: Normal range of motion.  Cardiovascular: Normal rate, regular rhythm, normal  heart sounds and intact distal pulses.  Pulmonary/Chest: Effort normal and breath sounds normal. She has no wheezes. She has no rhonchi. She has no rales.  Musculoskeletal:       Right lower leg: She exhibits no swelling.       Left lower leg: She exhibits no swelling.  Neurological: She is alert and oriented to person, place, and time.  Skin: Skin is warm and dry.  Psychiatric: She has a normal mood and affect.  Vitals reviewed.   Results  for orders placed or performed in visit on 11/05/17 (from the past 24 hour(s))  POCT glycosylated hemoglobin (Hb A1C)     Status: Abnormal   Collection Time: 11/05/17 12:25 PM  Result Value Ref Range   Hemoglobin A1C 6.5 (A) 4.0 - 5.6 %   HbA1c POC (<> result, manual entry)     HbA1c, POC (prediabetic range)     HbA1c, POC (controlled diabetic range)      Wt Readings from Last 3 Encounters:  07/25/17 265 lb 12.8 oz (120.6 kg)  01/07/17 262 lb 12.8 oz (119.2 kg)  10/06/16 264 lb (119.7 kg)   BP Readings from Last 3 Encounters:  11/05/17 121/81  07/25/17 133/88  01/07/17 138/87    Assessment and Plan :  1. Type 2 diabetes mellitus without complication, without long-term current use of insulin (HCC) A1C well controlled. Remains <7.0. Rec cont current med regimen and lifestyle modifications. F/u in 6 months.  - POCT glycosylated hemoglobin (Hb A1C) - metFORMIN (GLUCOPHAGE-XR) 750 MG 24 hr tablet; Take 1 tablet (750 mg total) by mouth daily with breakfast.  Dispense: 90 tablet; Refill: 1  2. Essential hypertension Well controlled. Continue med regimen.  - amLODipine (NORVASC) 5 MG tablet; Take 1 tablet (5 mg total) by mouth daily.  Dispense: 90 tablet; Refill: 1 - lisinopril (PRINIVIL,ZESTRIL) 20 MG tablet; Take 1 tablet (20 mg total) by mouth daily.  Dispense: 90 tablet; Refill: 1    Benjiman Core, PA-C  Primary [facility] at [facility] 11/05/2017 1:31 PM

## 2017-11-05 NOTE — Patient Instructions (Addendum)
I will contact you with your results from today. Plan to follow up in 6 months as long as everything is good! It was great seeing you today!   If you have lab work done today you will be contacted with your lab results within the next 2 weeks.  If you have not heard from [place] then please contact us. The fastest way to get your results is to register for My Chart.   IF you received an x-ray today, you will receive an invoice from [facility]. Please contact [facility] at [phone] with questions or concerns regarding your invoice.   IF you received labwork today, you will receive an invoice from [place]. Please contact [facility] at [phone] with questions or concerns regarding your invoice.   Our billing staff will not be able to assist you with questions regarding bills from these companies.  You will be contacted with the lab results as soon as they are available. The fastest way to get your results is to activate your My Chart account. Instructions are located on the last page of this paperwork. If you have not heard from [place] regarding the results in 2 weeks, please contact this office.

## 2017-11-06 ENCOUNTER — Telehealth: Payer: Self-pay | Admitting: Physician Assistant

## 2017-11-06 NOTE — Telephone Encounter (Addendum)
Opened in error

## 2017-11-07 MED ORDER — ZOSTER VAC RECOMB ADJUVANTED 50 MCG/0.5ML IM SUSR: 0.5000 mL | Freq: Once | INTRAMUSCULAR | 0 refills | Status: AC

## 2017-11-15 ENCOUNTER — Ambulatory Visit (HOSPITAL_COMMUNITY): Admit: 2017-11-15 | Payer: BC Managed Care – PPO | Admitting: Gastroenterology

## 2017-11-15 ENCOUNTER — Encounter (HOSPITAL_COMMUNITY): Payer: Self-pay

## 2017-11-15 SURGERY — COLONOSCOPY WITH PROPOFOL
Anesthesia: Monitor Anesthesia Care

## 2017-11-26 LAB — HM DIABETES EYE EXAM

## 2018-01-21 ENCOUNTER — Other Ambulatory Visit: Payer: Self-pay

## 2018-01-21 ENCOUNTER — Encounter: Payer: Self-pay | Admitting: Physician Assistant

## 2018-01-21 ENCOUNTER — Ambulatory Visit: Payer: BC Managed Care – PPO | Admitting: Physician Assistant

## 2018-01-21 VITALS — BP 138/78 | HR 87 | Temp 99.4°F | Resp 16 | Ht 60.0 in | Wt 257.4 lb

## 2018-01-21 DIAGNOSIS — R05 Cough: Secondary | ICD-10-CM | POA: Diagnosis not present

## 2018-01-21 DIAGNOSIS — R0981 Nasal congestion: Secondary | ICD-10-CM | POA: Diagnosis not present

## 2018-01-21 DIAGNOSIS — R058 Other specified cough: Secondary | ICD-10-CM

## 2018-01-21 MED ORDER — FLUTICASONE PROPIONATE 50 MCG/ACT NA SUSP: 2.0000 | Freq: Every day | NASAL | 12 refills | Status: DC

## 2018-01-21 MED ORDER — PREDNISONE 20 MG PO TABS: ORAL_TABLET | ORAL | 0 refills | Status: DC

## 2018-01-21 MED ORDER — AZITHROMYCIN 250 MG PO TABS: ORAL_TABLET | ORAL | 0 refills | Status: DC

## 2018-01-21 NOTE — Patient Instructions (Addendum)
Prednisone may elevated your blood sugar temporarily - keep an eye on your sugars.  Flonase: 2 sprays each nostril 1-2 times/day.  Try a Neti Pot (or saline nasal spray) - these will help wash out your nasal passages.  If you are not improving after 5-7 days, start taking Azithromycin. This is an antibiotic. Do not take this if you are starting to improve. Only take if your symptoms worsen.   Stay well hydrated. Get lost of rest. Wash your hands often.   -Foods that can help speed recovery: honey, garlic, chicken soup, elderberries, green tea.  -Supplements that can help speed recovery: vitamin C, zinc, elderberry extract, quercetin, ginseng, selenium -Supplement with prebiotics and probiotics:   Cepacol throat lozenges  For sore throat try using a honey-based tea. Use 3 teaspoons of honey with juice squeezed from half lemon. Place shaved pieces of ginger into 1/2-1 cup of water and warm over stove top. Then mix the ingredients and repeat every 4 hours as needed.  Cough Syrup Recipe: Sweet Lemon & Honey Thyme  Ingredients . a handful of fresh thyme sprigs   . 1 pint of water (2 cups)  . 1/2 cup honey (raw is best, but regular will do)  . 1/2 lemon chopped Instructions 1. Place the lemon in the pint jar and cover with the honey. The honey will macerate the lemons and draw out liquids which taste so delicious! 2. Meanwhile, toss the thyme leaves into a saucepan and cover them with the water. 3. Bring the water to a gentle simmer and reduce it to half, about a cup of tea. 4. When the tea is reduced and cooled a bit, strain the sprigs & leaves, add it into the pint jar and stir it well. 5. Give it a shake and use a spoonful as needed. 6. Store your homemade cough syrup in the refrigerator for about a month.  What causes a cough?  In adults, common causes of a cough include: ?An infection of the airways or lungs (such as the common cold) ?Postnasal drip - Postnasal drip is when mucus  from the nose drips down or flows along the back of the throat. Postnasal drip can happen when people have: .A cold .Allergies .A sinus infection - The sinuses are hollow areas in the bones of the face that open into the nose. ?Lung conditions, like asthma and chronic obstructive pulmonary disease (COPD) - Both of these conditions can make it hard to breathe. COPD is usually caused by smoking. ?Acid reflux - Acid reflux is when the acid that is normally in your stomach backs up into your esophagus (the tube that carries food from your mouth to your stomach). ?A side effect from blood pressure medicines called "ACE inhibitors" ?Smoking cigarettes  Is there anything I can do on my own to get rid of my cough?  Yes. To help get rid of your cough, you can: ?Use a humidifier in your bedroom ?Use an over-the-counter cough medicine, or suck on cough drops or hard candy ?Stop smoking, if you smoke ?If you have allergies, avoid the things you are allergic to (like pollen, dust, animals, or mold) If you have acid reflux, your doctor or nurse will tell you which lifestyle changes can help reduce symptoms.  Come back if no improvement in 7-10 days.    IF you received an x-ray today, you will receive an invoice from [facility]. Please contact [facility] at [phone] with questions or concerns regarding your invoice.  IF you received labwork today, you will receive an invoice from [facility]. Please contact [facility] at [phone] with questions or concerns regarding your invoice.   Our billing staff will not be able to assist you with questions regarding bills from these companies.  You will be contacted with the lab results as soon as they are available. The fastest way to get your results is to activate your My Chart account. Instructions are located on the last page of this paperwork. If you have not heard from [place] regarding the results in 2 weeks, please contact this office.

## 2018-01-21 NOTE — Progress Notes (Signed)
Victoria Hansen  MRN: [id number] DOB: [birth year]  PCP: Forrest Moron, MD  Subjective:  Pt is a 53 year old female who presents to clinic for cough and congestion x 4 weeks.  Endorses nasal congestion and postnasal drip. Denies fever, chills, shortness of breath, night sweats, dyspnea. She has not taken anything to feel better  Pt  has a past medical history of Diabetes mellitus without complication (Victoria Hansen) and Hypertension. Lab Results  Component Value Date   HGBA1C 6.5 (A) 11/05/2017    Review of Systems  Constitutional: Negative for chills, diaphoresis, fatigue and fever.  HENT: Positive for congestion, postnasal drip, rhinorrhea and sinus pressure. Negative for sinus pain and sore throat.   Respiratory: Positive for cough. Negative for shortness of breath and wheezing.   Neurological: Positive for headaches.  Psychiatric/Behavioral: Negative for sleep disturbance.    Patient Active Problem List   Diagnosis Date Noted  . Hyperlipidemia 01/07/2017  . Type 2 diabetes mellitus without complication, without long-term current use of insulin (Victoria Hansen) 12/15/2014  . Morbid obesity (Victoria Hansen) 12/08/2012  . Essential hypertension 12/08/2012    Current Outpatient Medications on File Prior to Visit  Medication Sig Dispense Refill  . amLODipine (NORVASC) 5 MG tablet Take 1 tablet (5 mg total) by mouth daily. 90 tablet 1  . aspirin EC 81 MG tablet Take 81 mg by mouth daily.    Victoria Hansen atorvastatin (LIPITOR) 40 MG tablet Take 1 tablet (40 mg total) by mouth daily. 90 tablet 3  . Blood Glucose Monitoring Suppl (BLOOD GLUCOSE MONITOR SYSTEM) w/Device KIT 1 Units by Does not apply route daily. 1 each 0  . cholecalciferol (VITAMIN D) 1000 units tablet Take 1,000 Units by mouth daily.    . indomethacin (INDOCIN) 50 MG capsule 50 mg 3 times daily; initiate within 24 to 48 hours of flare onset preferably; discontinue 2 to 3 days after resolution of clinical signs; usual duration: 5 to 7 days 30 capsule  1  . lisinopril (PRINIVIL,ZESTRIL) 20 MG tablet Take 1 tablet (20 mg total) by mouth daily. 90 tablet 1  . metFORMIN (GLUCOPHAGE-XR) 750 MG 24 hr tablet Take 1 tablet (750 mg total) by mouth daily with breakfast. 90 tablet 1   No current facility-administered medications on file prior to visit.     No Known Allergies   Objective:  BP 138/78 (BP Location: Left Arm, Patient Position: Sitting, Cuff Size: Large)   Pulse 87   Temp 99.4 F (37.4 C) (Oral)   Resp 16   Ht 5' (1.524 m)   Wt 257 lb 6.4 oz (116.8 kg)   LMP 10/26/2015 (Exact Date)   SpO2 100%   BMI 50.27 kg/m   Physical Exam Vitals signs reviewed.  Constitutional:      General: She is not in acute distress. HENT:     Right Ear: Tympanic membrane normal.     Left Ear: Tympanic membrane normal.     Nose: Mucosal edema present. No rhinorrhea.     Right Sinus: No maxillary sinus tenderness or frontal sinus tenderness.     Left Sinus: No maxillary sinus tenderness or frontal sinus tenderness.  Cardiovascular:     Rate and Rhythm: Normal rate and regular rhythm.     Heart sounds: Normal heart sounds.  Pulmonary:     Effort: Pulmonary effort is normal. No respiratory distress.     Breath sounds: Normal breath sounds. No wheezing or rales.  Skin:    General: Skin is warm and dry.  Neurological:     Mental Status: She is alert and oriented to person, place, and time.  Psychiatric:        Judgment: Judgment normal.     Assessment and Plan :  1. Upper airway cough syndrome - predniSONE (DELTASONE) 20 MG tablet; Take 3 PO QAM x2days, 2 PO QAM x2days, 1 PO QAM x2days  Dispense: 12 tablet; Refill: 0 - azithromycin (ZITHROMAX) 250 MG tablet; Take 2 tabs PO x 1 dose, then 1 tab PO QD x 4 days  Dispense: 6 tablet; Refill: 0 -Patient endorses cough x4 weeks.  No apparent distress.  Vitals are stable.  Symptoms are mild.  Plan to treat supportively 2. Nasal congestion - fluticasone (FLONASE) 50 MCG/ACT nasal spray; Place 2  sprays into both nostrils daily.  Dispense: 16 g; Refill: 12  Victoria Bryttani Blew, PA-C  [facility] at [facility] 01/21/2018 10:39 AM  Please note: Portions of this report may have been transcribed using dragon voice recognition software. Every effort was made to ensure accuracy; however, inadvertent computerized transcription errors may be present.

## 2018-04-05 ENCOUNTER — Other Ambulatory Visit: Payer: Self-pay

## 2018-04-05 ENCOUNTER — Other Ambulatory Visit: Payer: Self-pay | Admitting: Physician Assistant

## 2018-04-05 DIAGNOSIS — E785 Hyperlipidemia, unspecified: Secondary | ICD-10-CM

## 2018-04-05 DIAGNOSIS — E119 Type 2 diabetes mellitus without complications: Secondary | ICD-10-CM

## 2018-04-05 NOTE — Telephone Encounter (Signed)
Courtesy refill until appointment  

## 2018-05-03 ENCOUNTER — Other Ambulatory Visit: Payer: Self-pay | Admitting: Family Medicine

## 2018-05-03 DIAGNOSIS — E119 Type 2 diabetes mellitus without complications: Secondary | ICD-10-CM

## 2018-05-03 DIAGNOSIS — E785 Hyperlipidemia, unspecified: Secondary | ICD-10-CM

## 2018-05-04 ENCOUNTER — Ambulatory Visit: Payer: BC Managed Care – PPO | Admitting: Family Medicine

## 2018-05-05 ENCOUNTER — Other Ambulatory Visit: Payer: Self-pay

## 2018-05-05 ENCOUNTER — Telehealth: Payer: Self-pay | Admitting: Family Medicine

## 2018-05-05 ENCOUNTER — Telehealth (INDEPENDENT_AMBULATORY_CARE_PROVIDER_SITE_OTHER): Payer: BC Managed Care – PPO | Admitting: Family Medicine

## 2018-05-05 DIAGNOSIS — I10 Essential (primary) hypertension: Secondary | ICD-10-CM

## 2018-05-05 DIAGNOSIS — E1169 Type 2 diabetes mellitus with other specified complication: Secondary | ICD-10-CM | POA: Diagnosis not present

## 2018-05-05 DIAGNOSIS — D649 Anemia, unspecified: Secondary | ICD-10-CM | POA: Diagnosis not present

## 2018-05-05 DIAGNOSIS — E782 Mixed hyperlipidemia: Secondary | ICD-10-CM

## 2018-05-05 DIAGNOSIS — E785 Hyperlipidemia, unspecified: Secondary | ICD-10-CM

## 2018-05-05 MED ORDER — GLUCOSE BLOOD VI STRP: ORAL_STRIP | 12 refills | Status: AC

## 2018-05-05 NOTE — Progress Notes (Signed)
Contacted and triaged patient. Patient is following up 6 months medication check. Patient has no additional complaints. She need refills on diabetic test strips.

## 2018-05-05 NOTE — Telephone Encounter (Signed)
Pt needs a nurse visit with labs on 05/08/18 at 1:20p and a f/u visit 6 months anytime-htn. I called pt. She didn't pick up and no dpr.

## 2018-05-05 NOTE — Progress Notes (Signed)
[place] Encounter- SOAP NOTE Established Patient  This telephone encounter was conducted with the patient's (or proxy's) verbal consent via audio telecommunications: yes/no: Yes Patient was instructed to have this encounter in a suitably private space; and to only have persons present to whom they give permission to participate. In addition, patient identity was confirmed by use of name plus two identifiers (DOB and address).  I discussed the limitations, risks, security and privacy concerns of performing an evaluation and management service by telephone and the availability of in person appointments. I also discussed with the patient that there may be a patient responsible charge related to this service. The patient expressed understanding and agreed to proceed.  I spent a total of TIME; 0 MIN TO 60 MIN: 25 minutes talking with the patient or their proxy.  CC: diabetes   Subjective   Victoria Hansen is a 53 y.o. established patient. Telephone visit today for  HPI  Diabetes Mellitus: Patient presents for follow up of diabetes. Symptoms: none. Symptoms have stabilized. Patient denies foot ulcerations, hyperglycemia, hypoglycemia , increase appetite and paresthesia of the feet.  Evaluation to date has been included: hemoglobin A1C.  Home sugars: BGs consistently in an acceptable range. Treatment to date: Continued metformin which has been effective.  Lab Results  Component Value Date   HGBA1C 6.5 (A) 11/05/2017     Dyslipidemia: Patient presents for evaluation of lipids.  Compliance with treatment thus far has been good.  A repeat fasting lipid profile was done.  The patient does not use medications that may worsen dyslipidemias (corticosteroids, progestins, anabolic steroids, diuretics, beta-blockers, amiodarone, cyclosporine, olanzapine). The patient exercises weekly.  The patient is not known to have coexisting coronary artery disease.   Lab Results  Component Value Date   CHOL 151 07/25/2017   CHOL 165 01/07/2017   CHOL 224 (H) 03/10/2016   Lab Results  Component Value Date   HDL 41 07/25/2017   HDL 48 01/07/2017   HDL 47 03/10/2016   Lab Results  Component Value Date   LDLCALC 97 07/25/2017   LDLCALC 105 (H) 01/07/2017   LDLCALC 163 (H) 03/10/2016   Lab Results  Component Value Date   TRIG 66 07/25/2017   TRIG 58 01/07/2017   TRIG 72 03/10/2016   Lab Results  Component Value Date   CHOLHDL 3.7 07/25/2017   CHOLHDL 3.4 01/07/2017   CHOLHDL 4.8 (H) 03/10/2016   No results found for: LDLDIRECT  Anemia: Patient presents for presents evaluation of anemia. Anemia was found by routine CBC.  It has been present for a few years.  Associated signs & symptoms: none.  No palpitations, no fatigue.  She works as a Pharmacist, hospital.   Lab Results  Component Value Date   WBC 6.3 08/04/2017   HGB 11.5 08/04/2017   HCT 36.1 08/04/2017   MCV 84 08/04/2017   PLT 360 08/04/2017   Hypertension: Patient here for follow-up of elevated blood pressure. She is exercising and is adherent to low salt diet.  Blood pressure is well controlled at home. Cardiac symptoms none. Patient denies chest pain, chest pressure/discomfort, claudication, fatigue, lower extremity edema and near-syncope.  Cardiovascular risk factors: diabetes mellitus, dyslipidemia and hypertension. Use of agents associated with hypertension: none. History of target organ damage: none. BP Readings from Last 3 Encounters:  01/21/18 138/78  11/05/17 121/81  07/25/17 133/88     Patient Active Problem List   Diagnosis Date Noted  . Hyperlipidemia 01/07/2017  . Type 2  diabetes mellitus without complication, without long-term current use of insulin ([place]) 12/15/2014  . Morbid obesity ([place]) 12/08/2012  . Essential hypertension 12/08/2012    Past Medical History:  Diagnosis Date  . Diabetes mellitus without complication ([place])   . Hypertension     Current Outpatient Medications  Medication Sig Dispense  Refill  . amLODipine (NORVASC) 5 MG tablet Take 1 tablet (5 mg total) by mouth daily. 90 tablet 1  . aspirin EC 81 MG tablet Take 81 mg by mouth daily.    Marland Kitchen atorvastatin (LIPITOR) 40 MG tablet Take 1 tablet (40 mg total) by mouth daily at 6 PM. MUST KEEP 05/05/18 APPOINTMENT FOR ADDITIONAL REFILLS 30 tablet 0  . Blood Glucose Monitoring Suppl (BLOOD GLUCOSE MONITOR SYSTEM) w/Device KIT 1 Units by Does not apply route daily. 1 each 0  . cholecalciferol (VITAMIN D) 1000 units tablet Take 1,000 Units by mouth daily.    . fluticasone (FLONASE) 50 MCG/ACT nasal spray Place 2 sprays into both nostrils daily. 16 g 12  . indomethacin (INDOCIN) 50 MG capsule 50 mg 3 times daily; initiate within 24 to 48 hours of flare onset preferably; discontinue 2 to 3 days after resolution of clinical signs; usual duration: 5 to 7 days 30 capsule 1  . lisinopril (PRINIVIL,ZESTRIL) 20 MG tablet Take 1 tablet (20 mg total) by mouth daily. 90 tablet 1  . metFORMIN (GLUCOPHAGE-XR) 750 MG 24 hr tablet Take 1 tablet (750 mg total) by mouth daily with breakfast. 90 tablet 1  . glucose blood test strip Use to check blood glucose 1-2 times daily. Dispense type that matches machine. E11. 9 100 each 12  . predniSONE (DELTASONE) 20 MG tablet Take 3 PO QAM x2days, 2 PO QAM x2days, 1 PO QAM x2days (Patient not taking: Reported on 05/05/2018) 12 tablet 0   No current facility-administered medications for this visit.     No Known Allergies  Social History   Socioeconomic History  . Marital status: Married    Spouse name: Not on file  . Number of children: 2  . Years of education: Not on file  . Highest education level: Not on file  Occupational History  . Not on file  Social Needs  . Financial resource strain: Not on file  . Food insecurity:    Worry: Not on file    Inability: Not on file  . Transportation needs:    Medical: Not on file    Non-medical: Not on file  Tobacco Use  . Smoking status: Never Smoker  .  Smokeless tobacco: Never Used  Substance and Sexual Activity  . Alcohol use: Yes    Comment: occ  . Drug use: No  . Sexual activity: Yes  Lifestyle  . Physical activity:    Days per week: Not on file    Minutes per session: Not on file  . Stress: Not on file  Relationships  . Social connections:    Talks on phone: Not on file    Gets together: Not on file    Attends religious service: Not on file    Active member of club or organization: Not on file    Attends meetings of clubs or organizations: Not on file    Relationship status: Not on file  . Intimate partner violence:    Fear of current or ex partner: Not on file    Emotionally abused: Not on file    Physically abused: Not on file    Forced sexual activity: Not  on file  Other Topics Concern  . Not on file  Social History Narrative  . Not on file    ROS Review of Systems  Constitutional: Negative for activity change, appetite change, chills and fever.  HENT: Negative for congestion, nosebleeds, trouble swallowing and voice change.   Respiratory: Negative for cough, shortness of breath and wheezing.   Gastrointestinal: Negative for diarrhea, nausea and vomiting.  Genitourinary: Negative for difficulty urinating, dysuria, flank pain and hematuria.  Musculoskeletal: Negative for back pain, joint swelling and neck pain.  Neurological: Negative for dizziness, speech difficulty, light-headedness and numbness.  See HPI. All other review of systems negative.   Objective   Vitals as reported by the patient: There were no vitals filed for this visit.  Diagnoses and all orders for this visit:  Type 2 diabetes mellitus with hyperlipidemia ([place]) well controlled based on home glucose readings hemoglobin a1c is at goal Continue exercise Lipids monitored and renal function in range On metformin On ace On asa 36m Reviewed diabetic foot care Emphasized importance of eye and dental exam    -     CMP14+EGFR; Future -      Hemoglobin A1c; Future -     Lipid panel; Future -     Microalbumin, urine; Future  Anemia, unspecified type -     Cancel: CBC; Future  Mixed hyperlipidemia- discussed goal for diabetes Pt is eating a healthy diet -     Hemoglobin A1c; Future -     Lipid panel; Future  Patient's blood pressure is at goal of 139/89 or less. Condition is stable. Continue current medications and treatment plan. I recommend that you exercise for 30-45 minutes 5 days a week. I also recommend a balanced diet with fruits and vegetables every day, lean meats, and little fried foods. The DASH diet (you can find this online) is a good example of this.  Mild anemia-  Discussed using fer-in-sol, also discussed iron rich foods -     CBC; Future  Other orders -     glucose blood test strip; Use to check blood glucose 1-2 times daily. Dispense type that matches machine. E11. 9     I discussed the assessment and treatment plan with the patient. The patient was provided an opportunity to ask questions and all were answered. The patient agreed with the plan and demonstrated an understanding of the instructions.   The patient was advised to call back or seek an in-person evaluation if the symptoms worsen or if the condition fails to improve as anticipated.  I provided 25 minutes of non-face-to-face time during this encounter.  ZForrest Moron MD  Primary Care at [facility]

## 2018-05-11 ENCOUNTER — Other Ambulatory Visit: Payer: Self-pay | Admitting: Family Medicine

## 2018-05-11 DIAGNOSIS — E785 Hyperlipidemia, unspecified: Secondary | ICD-10-CM

## 2018-05-11 DIAGNOSIS — E119 Type 2 diabetes mellitus without complications: Secondary | ICD-10-CM

## 2018-05-11 NOTE — Telephone Encounter (Signed)
Requested medication (s) are due for refill today: yes  Requested medication (s) are on the active medication list: yes  Last refill:  04/05/18  Future visit scheduled: no  Notes to clinic:  Routed back to office . Pt did keep her tele visit but pt did not gets labs drawn. Please advise.   Requested Prescriptions  Pending Prescriptions Disp Refills   atorvastatin (LIPITOR) 40 MG tablet [Pharmacy Med Name: ATORVASTATIN 40MG  TABLETS] 30 tablet 0    Sig: TAKE 1 TABLET(40 MG) BY MOUTH DAILY AT 6 PM     Cardiovascular:  Antilipid - Statins Passed - 05/11/2018  2:30 PM      Passed - Total Cholesterol in normal range and within 360 days    Cholesterol, Total  Date Value Ref Range Status  07/25/2017 151 100 - 199 mg/dL Final         Passed - LDL in normal range and within 360 days    LDL Calculated  Date Value Ref Range Status  07/25/2017 97 0 - 99 mg/dL Final         Passed - HDL in normal range and within 360 days    HDL  Date Value Ref Range Status  07/25/2017 41 >39 mg/dL Final         Passed - Triglycerides in normal range and within 360 days    Triglycerides  Date Value Ref Range Status  07/25/2017 66 0 - 149 mg/dL Final         Passed - Patient is not pregnant      Passed - Valid encounter within last 12 months    Recent Outpatient Visits          3 months ago Upper airway cough syndrome   Primary Care at [facility], [place], PA-C   6 months ago Type 2 diabetes mellitus without complication, without long-term current use of insulin (HCC)   Primary Care at [place], [place], PA-C   9 months ago Pain of toe of left foot   Primary Care at [place], [place], PA-C   1 year ago Type 2 diabetes mellitus without complication, without long-term current use of insulin [facility])   Primary Care at [place], [place], PA-C   1 year ago Essential hypertension   Primary Care at [place], [place], [place]

## 2018-08-07 ENCOUNTER — Other Ambulatory Visit: Payer: Self-pay | Admitting: Family Medicine

## 2018-08-07 DIAGNOSIS — E785 Hyperlipidemia, unspecified: Secondary | ICD-10-CM

## 2018-08-07 DIAGNOSIS — E119 Type 2 diabetes mellitus without complications: Secondary | ICD-10-CM

## 2018-08-07 NOTE — Telephone Encounter (Signed)
Requested Prescriptions  Pending Prescriptions Disp Refills  . atorvastatin (LIPITOR) 40 MG tablet [Pharmacy Med Name: ATORVASTATIN 40MG  TABLETS] 90 tablet 0    Sig: TAKE 1 TABLET(40 MG) BY MOUTH DAILY AT 6 PM     Cardiovascular:  Antilipid - Statins Failed - 08/07/2018  3:48 AM      Failed - Total Cholesterol in normal range and within 360 days    Cholesterol, Total  Date Value Ref Range Status  07/25/2017 151 100 - 199 mg/dL Final         Failed - LDL in normal range and within 360 days    LDL Calculated  Date Value Ref Range Status  07/25/2017 97 0 - 99 mg/dL Final         Failed - HDL in normal range and within 360 days    HDL  Date Value Ref Range Status  07/25/2017 41 >39 mg/dL Final         Failed - Triglycerides in normal range and within 360 days    Triglycerides  Date Value Ref Range Status  07/25/2017 66 0 - 149 mg/dL Final         Passed - Patient is not pregnant      Passed - Valid encounter within last 12 months    Recent Outpatient Visits          6 months ago Upper airway cough syndrome   Primary Care at [facility], [place], PA-C   9 months ago Type 2 diabetes mellitus without complication, without long-term current use of insulin ([place])   Primary Care at [place], [place] D, PA-C   1 year ago Pain of toe of left foot   Primary Care at [place], [place] D, PA-C   1 year ago Type 2 diabetes mellitus without complication, without long-term current use of insulin [facility])   Primary Care at [place], [place] D, PA-C   1 year ago Essential hypertension   Primary Care at [place], [place] D, [place]

## 2018-08-11 ENCOUNTER — Other Ambulatory Visit: Payer: Self-pay | Admitting: Physician Assistant

## 2018-08-11 DIAGNOSIS — I10 Essential (primary) hypertension: Secondary | ICD-10-CM

## 2018-08-11 NOTE — Telephone Encounter (Signed)
Forwarding medication refill request clinical pool for review.

## 2018-08-24 NOTE — Telephone Encounter (Signed)
Left message to call.

## 2018-11-07 ENCOUNTER — Other Ambulatory Visit: Payer: Self-pay | Admitting: Physician Assistant

## 2018-11-07 DIAGNOSIS — E119 Type 2 diabetes mellitus without complications: Secondary | ICD-10-CM

## 2018-11-07 NOTE — Telephone Encounter (Signed)
Requested medication (s) are due for refill today: yes  Requested medication (s) are on the active medication list: yes  Last refill: 08/12/2018  Future visit scheduled: no  Notes to clinic:  Overdue for follow up Review for refill   Requested Prescriptions  Pending Prescriptions Disp Refills   metFORMIN (GLUCOPHAGE-XR) 750 MG 24 hr tablet [Pharmacy Med Name: METFORMIN ER 750MG 24HR TABS] 90 tablet 1    Sig: TAKE 1 TABLET(750 MG) BY MOUTH DAILY WITH BREAKFAST     Endocrinology:  Diabetes - Biguanides Failed - 11/07/2018  3:52 AM      Failed - Cr in normal range and within 360 days    Creat  Date Value Ref Range Status  12/14/2014 0.85 0.50 - 1.10 mg/dL Final   Creatinine, Ser  Date Value Ref Range Status  07/25/2017 0.94 0.57 - 1.00 mg/dL Final         Failed - HBA1C is between 0 and 7.9 and within 180 days    Hemoglobin A1C  Date Value Ref Range Status  11/05/2017 6.5 (A) 4.0 - 5.6 % Final   Hgb A1c MFr Bld  Date Value Ref Range Status  01/07/2017 6.4 (H) 4.8 - 5.6 % Final    Comment:             Prediabetes: 5.7 - 6.4          Diabetes: >6.4          Glycemic control for adults with diabetes: <7.0          Failed - eGFR in normal range and within 360 days    GFR calc Af Amer  Date Value Ref Range Status  07/25/2017 82 >59 mL/min/1.73 Final   GFR calc non Af Amer  Date Value Ref Range Status  07/25/2017 71 >59 mL/min/1.73 Final         Failed - Valid encounter within last 6 months    Recent Outpatient Visits          6 months ago Type 2 diabetes mellitus with hyperlipidemia ([place])   Primary Care at [facility] Zoe A, MD   9 months ago Upper airway cough syndrome   Primary Care at [facility], [place], PA-C   1 year ago Type 2 diabetes mellitus without complication, without long-term current use of insulin [facility])   Primary Care at [place], [place] D, PA-C   1 year ago Pain of toe of left foot   Primary Care at [place],  [place] D, PA-C   1 year ago Type 2 diabetes mellitus without complication, without long-term current use of insulin [facility])   Primary Care at [facility], [place] D, PA-C

## 2018-11-08 ENCOUNTER — Ambulatory Visit (INDEPENDENT_AMBULATORY_CARE_PROVIDER_SITE_OTHER): Payer: BC Managed Care – PPO | Admitting: Family Medicine

## 2018-11-08 ENCOUNTER — Other Ambulatory Visit: Payer: Self-pay

## 2018-11-08 DIAGNOSIS — E1169 Type 2 diabetes mellitus with other specified complication: Secondary | ICD-10-CM

## 2018-11-08 DIAGNOSIS — Z23 Encounter for immunization: Secondary | ICD-10-CM | POA: Diagnosis not present

## 2018-11-08 DIAGNOSIS — D649 Anemia, unspecified: Secondary | ICD-10-CM

## 2018-11-08 DIAGNOSIS — E785 Hyperlipidemia, unspecified: Secondary | ICD-10-CM

## 2018-11-08 DIAGNOSIS — E782 Mixed hyperlipidemia: Secondary | ICD-10-CM

## 2018-11-08 LAB — CMP14+EGFR
ALT: 15 IU/L (ref 0–32)
AST: 20 IU/L (ref 0–40)
Albumin/Globulin Ratio: 1.2 (ref 1.2–2.2)
Albumin: 4.3 g/dL (ref 3.8–4.9)
Alkaline Phosphatase: 129 IU/L — ABNORMAL HIGH (ref 39–117)
BUN/Creatinine Ratio: 16 (ref 9–23)
BUN: 15 mg/dL (ref 6–24)
Bilirubin Total: 0.4 mg/dL (ref 0.0–1.2)
CO2: 25 mmol/L (ref 20–29)
Calcium: 10.1 mg/dL (ref 8.7–10.2)
Chloride: 102 mmol/L (ref 96–106)
Creatinine, Ser: 0.94 mg/dL (ref 0.57–1.00)
GFR calc Af Amer: 81 mL/min/{1.73_m2} (ref >59)
GFR calc non Af Amer: 70 mL/min/{1.73_m2} (ref >59)
Globulin, Total: 3.5 g/dL (ref 1.5–4.5)
Glucose: 95 mg/dL (ref 65–99)
Potassium: 4.4 mmol/L (ref 3.5–5.2)
Sodium: 139 mmol/L (ref 134–144)
Total Protein: 7.8 g/dL (ref 6.0–8.5)

## 2018-11-08 LAB — CBC
Hematocrit: 35.8 % (ref 34.0–46.6)
Hemoglobin: 11.8 g/dL (ref 11.1–15.9)
MCH: 26.6 pg (ref 26.6–33.0)
MCHC: 33 g/dL (ref 31.5–35.7)
MCV: 81 fL (ref 79–97)
Platelets: 357 10*3/uL (ref 150–450)
RBC: 4.43 x10E6/uL (ref 3.77–5.28)
RDW: 14.7 % (ref 11.7–15.4)
WBC: 6.1 10*3/uL (ref 3.4–10.8)

## 2018-11-08 LAB — HEMOGLOBIN A1C
Est. average glucose Bld gHb Est-mCnc: 140 mg/dL
Hgb A1c MFr Bld: 6.5 % — ABNORMAL HIGH (ref 4.8–5.6)

## 2018-11-08 LAB — LIPID PANEL
Chol/HDL Ratio: 3.6 ratio (ref 0.0–4.4)
Cholesterol, Total: 151 mg/dL (ref 100–199)
HDL: 42 mg/dL (ref >39)
LDL Chol Calc (NIH): 97 mg/dL (ref 0–99)
Triglycerides: 58 mg/dL (ref 0–149)
VLDL Cholesterol Cal: 12 mg/dL (ref 5–40)

## 2018-11-09 LAB — MICROALBUMIN, URINE: Microalbumin, Urine: 11.1 ug/mL

## 2018-11-13 ENCOUNTER — Telehealth: Payer: Self-pay | Admitting: *Deleted

## 2018-11-13 NOTE — Telephone Encounter (Signed)
Left patient a detailed message to call back if she wanted to be scheduled on 01/02/2019 in Dr. Loletha Carrow' last hospital slot. Awaiting return call.

## 2018-12-19 ENCOUNTER — Other Ambulatory Visit: Payer: Self-pay

## 2018-12-19 DIAGNOSIS — E119 Type 2 diabetes mellitus without complications: Secondary | ICD-10-CM

## 2018-12-19 DIAGNOSIS — E785 Hyperlipidemia, unspecified: Secondary | ICD-10-CM

## 2018-12-19 MED ORDER — ATORVASTATIN CALCIUM 40 MG PO TABS: ORAL_TABLET | ORAL | 1 refills | Status: DC

## 2018-12-19 NOTE — Progress Notes (Signed)
Refill request for atorvastatin refilled #[id number] with  1 refill approved. Pt last ov/ lab visit 11/08/2018.

## 2018-12-23 ENCOUNTER — Other Ambulatory Visit: Payer: Self-pay | Admitting: Physician Assistant

## 2018-12-23 DIAGNOSIS — E119 Type 2 diabetes mellitus without complications: Secondary | ICD-10-CM

## 2018-12-24 NOTE — Telephone Encounter (Signed)
Requested medication (s) are due for refill today: yes  Requested medication (s) are on the active medication list: yes  Last refill:  08/12/2018  Future visit scheduled:yes  Notes to clinic: upcoming appointment  Review for refill   Requested Prescriptions  Pending Prescriptions Disp Refills   metFORMIN (GLUCOPHAGE-XR) 750 MG 24 hr tablet [Pharmacy Med Name: METFORMIN ER 750MG 24HR TABS] 90 tablet 1    Sig: TAKE 1 TABLET(750 MG) BY MOUTH DAILY WITH BREAKFAST      Endocrinology:  Diabetes - Biguanides Failed - 12/23/2018  2:04 PM      Failed - Valid encounter within last 6 months    Recent Outpatient Visits           1 month ago Mild anemia   Primary Care at [facility], Zoe A, [place]   7 months ago Type 2 diabetes mellitus with hyperlipidemia ([place])   Primary Care at [place], MD   11 months ago Upper airway cough syndrome   Primary Care at [facility], Gelene Mink, PA-C   1 year ago Type 2 diabetes mellitus without complication, without long-term current use of insulin ([place])   Primary Care at [place], [place] D, PA-C   1 year ago Pain of toe of left foot   Primary Care at [place], [place] D, PA-C              Passed - Cr in normal range and within 360 days    Creat  Date Value Ref Range Status  12/14/2014 0.85 0.50 - 1.10 mg/dL Final   Creatinine, Ser  Date Value Ref Range Status  11/08/2018 0.94 0.57 - 1.00 mg/dL Final          Passed - HBA1C is between 0 and 7.9 and within 180 days    Hgb A1c MFr Bld  Date Value Ref Range Status  11/08/2018 6.5 (H) 4.8 - 5.6 % Final    Comment:             Prediabetes: 5.7 - 6.4          Diabetes: >6.4          Glycemic control for adults with diabetes: <7.0           Passed - eGFR in normal range and within 360 days    GFR calc Af Amer  Date Value Ref Range Status  11/08/2018 81 >59 mL/min/1.73 Final   GFR calc non Af Amer  Date Value Ref Range Status  11/08/2018 70 >59  mL/min/1.73 Final

## 2019-01-08 ENCOUNTER — Ambulatory Visit (INDEPENDENT_AMBULATORY_CARE_PROVIDER_SITE_OTHER): Payer: BC Managed Care – PPO | Admitting: Family Medicine

## 2019-01-08 ENCOUNTER — Other Ambulatory Visit: Payer: Self-pay

## 2019-01-08 DIAGNOSIS — Z23 Encounter for immunization: Secondary | ICD-10-CM | POA: Diagnosis not present

## 2019-01-08 NOTE — Progress Notes (Signed)
2nd Shingrix has been given in LD and tolerated well.   Thanks, Lucillie Garfinkel

## 2019-01-10 LAB — HM DIABETES EYE EXAM

## 2019-02-08 ENCOUNTER — Other Ambulatory Visit: Payer: Self-pay | Admitting: Family Medicine

## 2019-02-08 DIAGNOSIS — I10 Essential (primary) hypertension: Secondary | ICD-10-CM

## 2019-02-08 NOTE — Telephone Encounter (Signed)
Forwarding medication refill requests to the clinical pool for review. 

## 2019-02-22 ENCOUNTER — Other Ambulatory Visit: Payer: Self-pay

## 2019-02-22 DIAGNOSIS — R0981 Nasal congestion: Secondary | ICD-10-CM

## 2019-02-22 DIAGNOSIS — I10 Essential (primary) hypertension: Secondary | ICD-10-CM

## 2019-02-22 MED ORDER — AMLODIPINE BESYLATE 5 MG PO TABS: ORAL_TABLET | ORAL | 0 refills | Status: DC

## 2019-02-22 MED ORDER — FLUTICASONE PROPIONATE 50 MCG/ACT NA SUSP: 2.0000 | Freq: Every day | NASAL | 12 refills | Status: DC

## 2019-02-22 MED ORDER — LISINOPRIL 20 MG PO TABS: ORAL_TABLET | ORAL | 0 refills | Status: DC

## 2019-04-10 ENCOUNTER — Telehealth: Payer: Self-pay

## 2019-04-10 NOTE — Telephone Encounter (Signed)
Left a detailed voicemail to get a follow up with the office, either Dr Myrtie Neither or an app. Last seen 2019. Needs an office visit to schedule a colonoscopy.

## 2019-05-14 NOTE — Telephone Encounter (Signed)
Patient letter mailed to address on file. Needs an office visit of colonoscopy at [facility] due to BMI

## 2019-05-19 ENCOUNTER — Other Ambulatory Visit: Payer: Self-pay | Admitting: Family Medicine

## 2019-05-19 DIAGNOSIS — I10 Essential (primary) hypertension: Secondary | ICD-10-CM

## 2019-05-19 NOTE — Telephone Encounter (Signed)
30 day courtesy refill- please ask pt to call office to make f/u appt Requested Prescriptions  Pending Prescriptions Disp Refills  . amLODipine (NORVASC) 5 MG tablet [Pharmacy Med Name: AMLODIPINE BESYLATE 5MG  TABLETS] 30 tablet 0    Sig: TAKE 1 TABLET(5 MG) BY MOUTH DAILY     Cardiovascular:  Calcium Channel Blockers Failed - 05/19/2019 11:43 AM      Failed - Valid encounter within last 6 months    Recent Outpatient Visits          4 months ago Need for viral immunization   Primary Care at [facility], [facility], MD   6 months ago Mild anemia   Primary Care at [facility], [facility] A, MD   1 year ago Type 2 diabetes mellitus with hyperlipidemia [facility])   Primary Care at [facility], Zoe A, MD   1 year ago Upper airway cough syndrome   Primary Care at [facility], [facility], PA-C   1 year ago Type 2 diabetes mellitus without complication, without long-term current use of insulin [facility])   Primary Care at [place], [place] D, PA-C             Passed - Last BP in normal range    BP Readings from Last 1 Encounters:  01/21/18 138/78         . lisinopril (ZESTRIL) 20 MG tablet [Pharmacy Med Name: LISINOPRIL 20MG  TABLETS] 30 tablet 0    Sig: TAKE 1 TABLET(20 MG) BY MOUTH DAILY     Cardiovascular:  ACE Inhibitors Failed - 05/19/2019 11:43 AM      Failed - Cr in normal range and within 180 days    Creat  Date Value Ref Range Status  12/14/2014 0.85 0.50 - 1.10 mg/dL Final   Creatinine, Ser  Date Value Ref Range Status  11/08/2018 0.94 0.57 - 1.00 mg/dL Final         Failed - K in normal range and within 180 days    Potassium  Date Value Ref Range Status  11/08/2018 4.4 3.5 - 5.2 mmol/L Final         Failed - Valid encounter within last 6 months    Recent Outpatient Visits          4 months ago Need for viral immunization   Primary Care at [facility], 11/10/2018, MD   6 months ago Mild anemia   Primary Care at [facility], Zoe A, MD   1 year ago  Type 2 diabetes mellitus with hyperlipidemia [facility])   Primary Care at [facility], [facility], MD   1 year ago Upper airway cough syndrome   Primary Care at [facility], [place], PA-C   1 year ago Type 2 diabetes mellitus without complication, without long-term current use of insulin [facility])   Primary Care at [place], [facility] D, [place]             Passed - Patient is not pregnant      Passed - Last BP in normal range    BP Readings from Last 1 Encounters:  01/21/18 138/78

## 2019-05-21 ENCOUNTER — Ambulatory Visit: Payer: BC Managed Care – PPO | Admitting: Family Medicine

## 2019-05-23 ENCOUNTER — Encounter: Payer: Self-pay | Admitting: Family Medicine

## 2019-05-23 ENCOUNTER — Other Ambulatory Visit: Payer: Self-pay

## 2019-05-23 ENCOUNTER — Ambulatory Visit: Payer: BC Managed Care – PPO | Admitting: Family Medicine

## 2019-05-23 VITALS — BP 139/84 | HR 89 | Temp 98.1°F | Resp 17 | Ht 61.0 in | Wt 274.8 lb

## 2019-05-23 DIAGNOSIS — E782 Mixed hyperlipidemia: Secondary | ICD-10-CM

## 2019-05-23 DIAGNOSIS — E119 Type 2 diabetes mellitus without complications: Secondary | ICD-10-CM

## 2019-05-23 DIAGNOSIS — Z1211 Encounter for screening for malignant neoplasm of colon: Secondary | ICD-10-CM

## 2019-05-23 DIAGNOSIS — Z1231 Encounter for screening mammogram for malignant neoplasm of breast: Secondary | ICD-10-CM

## 2019-05-23 DIAGNOSIS — I10 Essential (primary) hypertension: Secondary | ICD-10-CM

## 2019-05-23 LAB — POCT GLYCOSYLATED HEMOGLOBIN (HGB A1C): Hemoglobin A1C: 6.6 % — AB (ref 4.0–5.6)

## 2019-05-23 NOTE — Patient Instructions (Addendum)
If you have lab work done today you will be contacted with your lab results within the next 2 weeks.  If you have not heard from [place] then please contact us. The fastest way to get your results is to register for My Chart.   IF you received an x-ray today, you will receive an invoice from [facility]. Please contact [facility] at [phone] with questions or concerns regarding your invoice.   IF you received labwork today, you will receive an invoice from [facility]. Please contact [facility] at [phone] with questions or concerns regarding your invoice.   Our billing staff will not be able to assist you with questions regarding bills from these companies.  You will be contacted with the lab results as soon as they are available. The fastest way to get your results is to activate your My Chart account. Instructions are located on the last page of this paperwork. If you have not heard from [place] regarding the results in 2 weeks, please contact this office.     Obesity Hypoventilation Syndrome  Obesity hypoventilation syndrome (OHS) means that you are not breathing well enough to get air in and out of your lungs efficiently (ventilation). This causes a low oxygen level and a high carbon dioxide level in your blood (hypoventilation). Having too much total body fat (obesity) is a significant risk factor for developing OHS. OHS makes it harder for your heart to pump oxygen-rich blood to your body. It can cause sleep disturbances and make you feel sleepy during the day. Over time, OHS can increase your risk for:  Heart disease.  High blood pressure (hypertension).  Reduced ability to absorb sugar from the bloodstream (insulin resistance).  Heart failure. Over time, OHS weakens your heart and can lead to heart failure. What are the causes? The exact cause of OHS is not known. Possible causes include:  Pressure on the lungs from excess body weight.  Obesity-related  changes in how much air the lungs can hold (lung capacity) and how much they can expand (lung compliance).  Failure of the brain to regulate oxygen and carbon dioxide levels properly.  Chemicals (hormones) produced by excess fat cells interfering with breathing regulation.  A breathing condition in which breathing pauses or becomes shallow during sleep (sleep apnea). This condition can eventually cause the body to ventilate poorly and to hold onto carbon dioxide during the day. What increases the risk? You may have a greater risk for OHS if you:  Have a BMI of 30 or higher. BMI is an estimate of body fat that is calculated from height and weight. For adults, a BMI of 30 or higher is considered obese.  Are 53?53 years old.  Carry most of your excess weight around your waist.  Experience moderate symptoms of sleep apnea. What are the signs or symptoms? The most common symptoms of OHS are:  Daytime sleepiness.  Lack of energy.  Shortness of breath.  Morning headaches.  Sleep apnea.  Trouble concentrating.  Irritability, mood swings, or depression.  Swollen veins in the neck.  Swelling of the legs. How is this diagnosed? Your health care provider may suspect OHS if you are obese and have poor breathing during the day and at night. Your health care provider will also do a physical exam. You may have tests to:  Measure your BMI.  Measure your blood oxygen level with a sensor placed on your finger (pulse oximetry).  Measure blood oxygen and carbon dioxide in  a blood sample.  Measure the amount of red blood cells in a blood sample. OHS causes the number of red blood cells you have to increase (polycythemia).  Check your breathing ability (pulmonary function testing).  Check your breathing ability, breathing patterns, and oxygen level while you sleep (sleep study). You may also have a chest X-ray to rule out other breathing problems. You may have an electrocardiogram (ECG)  and or echocardiogram to check for signs of heart failure. How is this treated? Weight loss is the most important part of treatment for OHS, and it may be the only treatment that you need. Other treatments may include:  Using a device to open your airway while you sleep, such as a continuous positive airway pressure (CPAP) machine that delivers oxygen to your airway through a mask.  Surgery (gastric bypass surgery) to lower your BMI. This may be needed if: ? You are very obese. ? Other treatments have not worked for you. ? Your OHS is very severe and is causing organ damage, such as heart failure. Follow these instructions at home:  Medicines  Take over-the-counter and prescription medicines only as told by your health care provider.  Ask your health care provider what medicines are safe for you. You may be told to avoid medicines that can impair breathing and make OHS worse, such as sedatives and narcotics. Sleeping habits  If you are prescribed a CPAP machine, make sure you understand and use the machine as directed.  Try to get 8 hours of sleep every night.  Go to bed at the same time every night, and get up at the same time every day. General instructions  Work with your health care provider to make a diet and exercise plan that helps you reach and maintain a healthy weight.  Eat a healthy diet.  Avoid smoking.  Exercise regularly as told by your health care provider.  During the evening, do not drink caffeine and do not eat heavy meals.  Keep all follow-up visits as told by your health care provider. This is important. Contact a health care provider if:  You experience new or worsening shortness of breath.  You have chest pain.  You have an irregular heartbeat (palpitations).  You have dizziness.  You faint.  You develop a cough.  You have a fever.  You have chest pain when you breathe (pleurisy). This information is not intended to replace advice given to  you by your health care provider. Make sure you discuss any questions you have with your health care provider. Document Revised: 04/21/2018 Document Reviewed: 06/09/2015 Elsevier Patient Education  [place].

## 2019-05-23 NOTE — Progress Notes (Unsigned)
Established Patient Office Visit  Subjective:  Patient ID: Victoria Hansen, female    DOB: [birth year]  Age: 54 y.o. MRN: [id number]  CC:  Chief Complaint  Patient presents with  . Medication Refill    amlodipine, lisinopril, indocin, atorvastatin    HPI Victoria Hansen presents for   Hypertension: Patient here for follow-up of elevated blood pressure. She is not exercising and is adherent to low salt diet.  Blood pressure is well controlled at home. Cardiac symptoms none. Patient denies chest pain, dyspnea, fatigue and near-syncope.  Cardiovascular risk factors: diabetes mellitus, dyslipidemia, hypertension, obesity (BMI >= 30 kg/m2) and sedentary lifestyle. Use of agents associated with hypertension: none. History of target organ damage: none. BP Readings from Last 3 Encounters:  05/23/19 139/84  01/21/18 138/78  11/05/17 121/81   Gout  She takes indomethacin for gout attacks No recent attacks She usually get them in her toes but not in the upper extremities Her triggers include   Morbid Obesity Patient states that she did not know she gained weight.  She is not exercising much but she is trying to start now by doing some walking.  She is trying to cut out sugary drinks and also limit carbohydrates.  Wt Readings from Last 3 Encounters:  05/23/19 274 lb 12.8 oz (124.6 kg)  01/21/18 257 lb 6.4 oz (116.8 kg)  07/25/17 265 lb 12.8 oz (120.6 kg)   Diabetes Mellitus: Patient presents for follow up of diabetes. Symptoms: none. Symptoms have stabilized. Patient denies foot ulcerations, hypoglycemia  and increase appetite.  Evaluation to date has been included: hemoglobin A1C.  Home sugars: patient does not check sugars. Treatment to date: Continued metformin which has been effective.  Lab Results  Component Value Date   HGBA1C 6.5 (H) 11/08/2018     Past Medical History:  Diagnosis Date  . Diabetes mellitus without complication ([place])   . Hypertension     Past  Surgical History:  Procedure Laterality Date  . TUBAL LIGATION      Family History  Problem Relation Age of Onset  . Hypertension Mother   . Thyroid disease Mother   . Heart disease Father   . Hypertension Father   . Gout Father   . Gout Brother     Social History   Socioeconomic History  . Marital status: Married    Spouse name: Not on file  . Number of children: 2  . Years of education: Not on file  . Highest education level: Not on file  Occupational History  . Not on file  Tobacco Use  . Smoking status: Never Smoker  . Smokeless tobacco: Never Used  Substance and Sexual Activity  . Alcohol use: Yes    Comment: occ  . Drug use: No  . Sexual activity: Yes  Other Topics Concern  . Not on file  Social History Narrative  . Not on file   Social Determinants of Health   Financial Resource Strain:   . Difficulty of Paying Living Expenses:   Food Insecurity:   . Worried About Charity fundraiser in the Last Year:   . Arboriculturist in the Last Year:   Transportation Needs:   . Film/video editor (Medical):   Marland Kitchen Lack of Transportation (Non-Medical):   Physical Activity:   . Days of Exercise per Week:   . Minutes of Exercise per Session:   Stress:   . Feeling of Stress :   Social Connections:   .  Frequency of Communication with Friends and Family:   . Frequency of Social Gatherings with Friends and Family:   . Attends Religious Services:   . Active Member of Clubs or Organizations:   . Attends Archivist Meetings:   Marland Kitchen Marital Status:   Intimate Partner Violence:   . Fear of Current or Ex-Partner:   . Emotionally Abused:   Marland Kitchen Physically Abused:   . Sexually Abused:     Outpatient Medications Prior to Visit  Medication Sig Dispense Refill  . amLODipine (NORVASC) 5 MG tablet TAKE 1 TABLET(5 MG) BY MOUTH DAILY 30 tablet 0  . aspirin EC 81 MG tablet Take 81 mg by mouth daily.    Marland Kitchen atorvastatin (LIPITOR) 40 MG tablet TAKE 1 TABLET(40 MG) BY MOUTH  DAILY AT 6 PM 90 tablet 1  . Blood Glucose Monitoring Suppl (BLOOD GLUCOSE MONITOR SYSTEM) w/Device KIT 1 Units by Does not apply route daily. 1 each 0  . cholecalciferol (VITAMIN D) 1000 units tablet Take 1,000 Units by mouth daily.    . fluticasone (FLONASE) 50 MCG/ACT nasal spray Place 2 sprays into both nostrils daily. 16 g 12  . glucose blood test strip Use to check blood glucose 1-2 times daily. Dispense type that matches machine. E11. 9 100 each 12  . indomethacin (INDOCIN) 50 MG capsule 50 mg 3 times daily; initiate within 24 to 48 hours of flare onset preferably; discontinue 2 to 3 days after resolution of clinical signs; usual duration: 5 to 7 days 30 capsule 1  . lisinopril (ZESTRIL) 20 MG tablet TAKE 1 TABLET(20 MG) BY MOUTH DAILY 30 tablet 0  . metFORMIN (GLUCOPHAGE-XR) 750 MG 24 hr tablet TAKE 1 TABLET(750 MG) BY MOUTH DAILY WITH BREAKFAST 90 tablet 1  . predniSONE (DELTASONE) 20 MG tablet Take 3 PO QAM x2days, 2 PO QAM x2days, 1 PO QAM x2days 12 tablet 0   No facility-administered medications prior to visit.    No Known Allergies  ROS Review of Systems Review of Systems  Constitutional: Negative for activity change, appetite change, chills and fever.  HENT: Negative for congestion, nosebleeds, trouble swallowing and voice change.   Respiratory: Negative for cough, shortness of breath and wheezing.   Gastrointestinal: Negative for diarrhea, nausea and vomiting.  Genitourinary: Negative for difficulty urinating, dysuria, flank pain and hematuria.  Musculoskeletal: Negative for back pain, joint swelling and neck pain.  Neurological: Negative for dizziness, speech difficulty, light-headedness and numbness.  See HPI. All other review of systems negative.     Objective:    Physical Exam  BP 139/84 (BP Location: Right Arm, Patient Position: Sitting, Cuff Size: Large)   Pulse 89   Temp 98.1 F (36.7 C) (Temporal)   Resp 17   Ht '5\' 1"'  (1.549 m)   Wt 274 lb 12.8 oz (124.6  kg)   LMP 10/26/2015 (Exact Date)   SpO2 100%   BMI 51.92 kg/m  Wt Readings from Last 3 Encounters:  05/23/19 274 lb 12.8 oz (124.6 kg)  01/21/18 257 lb 6.4 oz (116.8 kg)  07/25/17 265 lb 12.8 oz (120.6 kg)   Physical Exam  Constitutional: Oriented to person, place, and time. Appears well-developed and well-nourished.  HENT:  Head: Normocephalic and atraumatic.  Eyes: Conjunctivae and EOM are normal.  Cardiovascular: Normal rate, regular rhythm, normal heart sounds and intact distal pulses.  No murmur heard. Pulmonary/Chest: Effort normal and breath sounds normal. No stridor. No respiratory distress. Has no wheezes.  Neurological: Is alert and oriented to  person, place, and time.  Skin: Skin is warm. Capillary refill takes less than 2 seconds.  Psychiatric: Has a normal mood and affect. Behavior is normal. Judgment and thought content normal.    Health Maintenance Due  Topic Date Due  . MAMMOGRAM  07/31/2016  . COLONOSCOPY  Never done  . HEMOGLOBIN A1C  05/09/2019  . PAP SMEAR-Modifier  07/01/2019    There are no preventive care reminders to display for this patient.  Lab Results  Component Value Date   TSH 1.160 07/25/2017   Lab Results  Component Value Date   WBC 6.1 11/08/2018   HGB 11.8 11/08/2018   HCT 35.8 11/08/2018   MCV 81 11/08/2018   PLT 357 11/08/2018   Lab Results  Component Value Date   NA 139 11/08/2018   K 4.4 11/08/2018   CO2 25 11/08/2018   GLUCOSE 95 11/08/2018   BUN 15 11/08/2018   CREATININE 0.94 11/08/2018   BILITOT 0.4 11/08/2018   ALKPHOS 129 (H) 11/08/2018   AST 20 11/08/2018   ALT 15 11/08/2018   PROT 7.8 11/08/2018   ALBUMIN 4.3 11/08/2018   CALCIUM 10.1 11/08/2018   Lab Results  Component Value Date   CHOL 151 11/08/2018   Lab Results  Component Value Date   HDL 42 11/08/2018   Lab Results  Component Value Date   LDLCALC 97 11/08/2018   Lab Results  Component Value Date   TRIG 58 11/08/2018   Lab Results    Component Value Date   CHOLHDL 3.6 11/08/2018   Lab Results  Component Value Date   HGBA1C 6.5 (H) 11/08/2018      Assessment & Plan:   Problem List Items Addressed This Visit      Cardiovascular and Mediastinum   Essential hypertension   Relevant Orders   Comprehensive metabolic panel     Endocrine   Type 2 diabetes mellitus without complication, without long-term current use of insulin (HCC)   Relevant Orders   HM Diabetes Foot Exam (Completed)   POCT glycosylated hemoglobin (Hb A1C)     Other   Hyperlipidemia  -    Relevant Orders   Comprehensive metabolic panel    Other Visit Diagnoses    Encounter for screening mammogram for malignant neoplasm of breast    -  Primary   Relevant Orders   MM Digital Screening   Screening for colon cancer       Relevant Orders   Ambulatory referral to Gastroenterology      No orders of the defined types were placed in this encounter.   Follow-up: Return in about 6 months (around 11/23/2019) for follow up on cholesterol and diabetes.    Forrest Moron, MD

## 2019-05-24 LAB — COMPREHENSIVE METABOLIC PANEL
ALT: 15 IU/L (ref 0–32)
AST: 20 IU/L (ref 0–40)
Albumin/Globulin Ratio: 1.1 — ABNORMAL LOW (ref 1.2–2.2)
Albumin: 4.2 g/dL (ref 3.8–4.9)
Alkaline Phosphatase: 149 IU/L — ABNORMAL HIGH (ref 39–117)
BUN/Creatinine Ratio: 18 (ref 9–23)
BUN: 14 mg/dL (ref 6–24)
Bilirubin Total: 0.4 mg/dL (ref 0.0–1.2)
CO2: 22 mmol/L (ref 20–29)
Calcium: 9.9 mg/dL (ref 8.7–10.2)
Chloride: 102 mmol/L (ref 96–106)
Creatinine, Ser: 0.79 mg/dL (ref 0.57–1.00)
GFR calc Af Amer: 100 mL/min/{1.73_m2} (ref >59)
GFR calc non Af Amer: 86 mL/min/{1.73_m2} (ref >59)
Globulin, Total: 3.8 g/dL (ref 1.5–4.5)
Glucose: 87 mg/dL (ref 65–99)
Potassium: 4.5 mmol/L (ref 3.5–5.2)
Sodium: 141 mmol/L (ref 134–144)
Total Protein: 8 g/dL (ref 6.0–8.5)

## 2019-05-28 ENCOUNTER — Encounter: Payer: Self-pay | Admitting: Gastroenterology

## 2019-07-03 ENCOUNTER — Encounter: Payer: Self-pay | Admitting: Gastroenterology

## 2019-07-03 ENCOUNTER — Ambulatory Visit: Payer: BC Managed Care – PPO | Admitting: Gastroenterology

## 2019-07-03 VITALS — BP 130/78 | HR 112 | Ht 61.0 in | Wt 262.0 lb

## 2019-07-03 DIAGNOSIS — Z1211 Encounter for screening for malignant neoplasm of colon: Secondary | ICD-10-CM

## 2019-07-03 NOTE — Patient Instructions (Signed)
If you are age 53 or older, your body mass index should be between 23-30. Your Body mass index is 49.5 kg/m. If this is out of the aforementioned range listed, please consider follow up with your Primary Care Provider.  If you are age 73 or younger, your body mass index should be between 19-25. Your Body mass index is 49.5 kg/m. If this is out of the aformentioned range listed, please consider follow up with your Primary Care Provider.   You will be due for a recall colonoscopy in 11-2019. We will send you a reminder in the mail when it gets closer to that time.  It was a pleasure to see you today!  Dr. Myrtie Neither

## 2019-07-03 NOTE — Progress Notes (Signed)
[facility] Consult Note:  History: Victoria Hansen 07/03/2019  Referring provider: Forrest Moron, MD  Reason for consult/chief complaint: Colon Cancer Screening (no GI issues)   Subjective  HPI: Patient was referred in the fall 2019 for screening colonoscopy.  It had to be done in the [name] long outpatient endoscopy lab due to patient's elevated BMI.  We contacted the patient in late 2019 when those slots were available, but she did not feel it fit her schedule at that time.  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  [name] feels well from a digestive standpoint, without nausea, vomiting, dysphagia, altered bowel habits or rectal bleeding.  She is glad to report that in about the last 6 weeks, she has been able to lose about 13 pounds through diet and increased activity.  She is walking daily and very pleased with the results.  Her bowel habits have become more regular, her joints feel better, she is sleeping better and has a general overall improved sense of wellbeing.   ROS:  Review of Systems She denies chest pain dyspnea or dysuria Sometimes she would get foot or ankle swelling toward the end of the day, that is also improved lately.  Past Medical History: Past Medical History:  Diagnosis Date  . Diabetes mellitus without complication ([place])   . Gout   . Hypertension   . Obesity      Past Surgical History: Past Surgical History:  Procedure Laterality Date  . TUBAL LIGATION       Family History: Family History  Problem Relation Age of Onset  . Hypertension Mother   . Thyroid disease Mother   . Heart disease Father   . Hypertension Father   . Gout Father   . Gout Brother   . Colon cancer Neg Hx   . Rectal cancer Neg Hx   . Esophageal cancer Neg Hx     Social History: Social History   Socioeconomic History  . Marital status: Married    Spouse name: Not  on file  . Number of children: 2  . Years of education: Not on file  . Highest education level: Not on file  Occupational History  . Occupation: retired Pharmacist, hospital  Tobacco Use  . Smoking status: Never Smoker  . Smokeless tobacco: Never Used  Vaping Use  . Vaping Use: Never used  Substance and Sexual Activity  . Alcohol use: Yes    Comment: occ  . Drug use: No  . Sexual activity: Yes  Other Topics Concern  . Not on file  Social History Narrative  . Not on file   Social Determinants of Health   Financial Resource Strain:   . Difficulty of Paying Living Expenses:   Food Insecurity:   . Worried About Charity fundraiser in the Last Year:   . Arboriculturist in the Last Year:   Transportation Needs:   . Film/video editor (Medical):   Marland Kitchen Lack of Transportation (Non-Medical):   Physical Activity:   . Days of Exercise per Week:   . Minutes of Exercise per Session:   Stress:   . Feeling of Stress :   Social Connections:   . Frequency of Communication with Friends and Family:   . Frequency of Social Gatherings with Friends and Family:   . Attends Religious Services:   . Active Member of Clubs or Organizations:   .  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  Allergies: No Known Allergies  Outpatient Meds: Current Outpatient Medications  Medication Sig Dispense Refill  . amLODipine (NORVASC) 5 MG tablet TAKE 1 TABLET(5 MG) BY MOUTH DAILY 30 tablet 0  . aspirin EC 81 MG tablet Take 81 mg by mouth daily.    Marland Kitchen atorvastatin (LIPITOR) 40 MG tablet TAKE 1 TABLET(40 MG) BY MOUTH DAILY AT 6 PM 90 tablet 1  . Blood Glucose Monitoring Suppl (BLOOD GLUCOSE MONITOR SYSTEM) w/Device KIT 1 Units by Does not apply route daily. 1 each 0  . cholecalciferol (VITAMIN D) 1000 units tablet Take 1,000 Units by mouth daily.    . fluticasone (FLONASE) 50 MCG/ACT nasal spray Place 2 sprays into both nostrils daily.  16 g 12  . glucose blood test strip Use to check blood glucose 1-2 times daily. Dispense type that matches machine. E11. 9 100 each 12  . indomethacin (INDOCIN) 50 MG capsule 50 mg 3 times daily; initiate within 24 to 48 hours of flare onset preferably; discontinue 2 to 3 days after resolution of clinical signs; usual duration: 5 to 7 days 30 capsule 1  . lisinopril (ZESTRIL) 20 MG tablet TAKE 1 TABLET(20 MG) BY MOUTH DAILY 30 tablet 0  . metFORMIN (GLUCOPHAGE-XR) 750 MG 24 hr tablet TAKE 1 TABLET(750 MG) BY MOUTH DAILY WITH BREAKFAST 90 tablet 1   No current facility-administered medications for this visit.      ___________________________________________________________________ Objective   Exam:  BP 130/78   Pulse (!) 112   Ht '5\' 1"'  (1.549 m)   Wt 262 lb (118.8 kg)   LMP 10/26/2015 (Exact Date)   BMI 49.50 kg/m    General: Well-appearing, pleasant and conversational.  Gets on exam table without assistance.  Eyes: sclera anicteric, no redness  ENT: oral mucosa moist without lesions, no cervical or supraclavicular lymphadenopathy.  Normal neck range of motion.  CV: RRR without murmur, S1/S2, no JVD, no peripheral edema  Resp: clear to auscultation bilaterally, normal RR and effort noted  GI: soft, no tenderness, with active bowel sounds. No guarding or palpable organomegaly noted, limited by body habitus  Skin; warm and dry, no rash or jaundice noted  Neuro: awake, alert and oriented x 3. Normal gross motor function and fluent speech  Labs:  CMP Latest Ref Rng & Units 05/23/2019 11/08/2018 07/25/2017  Glucose 65 - 99 mg/dL 87 95 120(H)  BUN 6 - 24 mg/dL '14 15 14  ' Creatinine 0.57 - 1.00 mg/dL 0.79 0.94 0.94  Sodium 134 - 144 mmol/L 141 139 141  Potassium 3.5 - 5.2 mmol/L 4.5 4.4 4.4  Chloride 96 - 106 mmol/L 102 102 99  CO2 20 - 29 mmol/L '22 25 28  ' Calcium 8.7 - 10.2 mg/dL 9.9 10.1 10.2  Total Protein 6.0 - 8.5 g/dL 8.0 7.8 7.6  Total Bilirubin 0.0 - 1.2 mg/dL 0.4  0.4 0.3  Alkaline Phos 39 - 117 IU/L 149(H) 129(H) 107  AST 0 - 40 IU/L '20 20 16  ' ALT 0 - 32 IU/L '15 15 14     ' Assessment: Encounter Diagnoses  Name Primary?  . Special screening for malignant neoplasms, colon Yes  . Morbid obesity ([place])     Average risk for colorectal cancer.  Appropriate for screening colonoscopy.  She is right at a BMI of 50, which currently would require procedure done in the outpatient hospital  endoscopy lab.  Given her recent success with weight loss through diet and exercise, it seems likely she will be able to lose some more weight, bring her down below BMI 50 cut off, thus probably allowing colonoscopy in our endoscopy lab later this year.  It is a screening exam, so can safely wait that long.  I offered her that plan versus scheduling her in the hospital endoscopy lab, which is currently booking out about 2 months for routine procedures.   Plan:  Zanetta would like to continue her healthy lifestyle journey with some further weight loss and contact us in several months when she has further success.  We will put her in for recall later this year so as not to lose track of her.  If she would like to do the procedure sooner than that at the hospital endoscopy lab, she will contact us and arrangements will be made.  Thank you for the courtesy of this consult.  Please call me with any questions or concerns.  Nelida Meuse III  CC: Referring provider noted above

## 2019-12-04 ENCOUNTER — Encounter: Payer: Self-pay | Admitting: Gastroenterology

## 2020-07-28 ENCOUNTER — Telehealth: Payer: Self-pay | Admitting: Internal Medicine

## 2020-07-28 NOTE — Telephone Encounter (Signed)
This patient's provider stopped practicing, patient needs new PCP, but also needs med refills  metFORMIN (GLUCOPHAGE-XR) 750 MG 24 hr tablet  atorvastatin (LIPITOR) 40 MG tablet  Can you refill this for her, or can we move her new patient appointment up? Currently, new patient appointment on 10/20/20  [id number] [place], [place] - [address] AT [facility] & [address] Phone:  [phone]  Fax:  [phone]

## 2020-07-28 NOTE — Telephone Encounter (Signed)
Pt states she has been seen by Dr Creta Levin at the [facility] recently & had labwork drawn by [facility]; last documented OV with PCP is 05/23/19.  Pt advised to send OV notes; given office fax number of [phone]. Will send request to Dr Okey Dupre to determine if courtesy refill can be given or new patient appt moved up; pt verb understanding.

## 2020-07-29 NOTE — Telephone Encounter (Addendum)
Patient dropped off her last labwork. Stated she would call her previous practice and have the office notes faxed over. Placed the labwork in [facility]'s box.

## 2020-08-04 NOTE — Telephone Encounter (Signed)
As noted previously I am sorry but I cannot prescribe medications for someone who is not my current patient for safety reasons.

## 2020-08-05 NOTE — Telephone Encounter (Signed)
LVM requesting pt return call to reschedule appt to meet PCP parameters as noted in previous message.

## 2020-08-15 ENCOUNTER — Encounter: Payer: Self-pay | Admitting: Internal Medicine

## 2020-08-15 ENCOUNTER — Other Ambulatory Visit: Payer: Self-pay

## 2020-08-15 ENCOUNTER — Ambulatory Visit: Payer: BC Managed Care – PPO | Admitting: Internal Medicine

## 2020-08-15 ENCOUNTER — Telehealth: Payer: Self-pay | Admitting: Emergency Medicine

## 2020-08-15 DIAGNOSIS — E118 Type 2 diabetes mellitus with unspecified complications: Secondary | ICD-10-CM | POA: Diagnosis not present

## 2020-08-15 DIAGNOSIS — Z8739 Personal history of other diseases of the musculoskeletal system and connective tissue: Secondary | ICD-10-CM

## 2020-08-15 DIAGNOSIS — E785 Hyperlipidemia, unspecified: Secondary | ICD-10-CM

## 2020-08-15 DIAGNOSIS — I10 Essential (primary) hypertension: Secondary | ICD-10-CM | POA: Diagnosis not present

## 2020-08-15 DIAGNOSIS — R0981 Nasal congestion: Secondary | ICD-10-CM

## 2020-08-15 DIAGNOSIS — E1169 Type 2 diabetes mellitus with other specified complication: Secondary | ICD-10-CM

## 2020-08-15 LAB — LIPID PANEL
Cholesterol: 183 mg/dL (ref 0–200)
HDL: 44.4 mg/dL (ref >39.00)
LDL Cholesterol: 127 mg/dL — ABNORMAL HIGH (ref 0–99)
NonHDL: 138.34
Total CHOL/HDL Ratio: 4
Triglycerides: 55 mg/dL (ref 0.0–149.0)
VLDL: 11 mg/dL (ref 0.0–40.0)

## 2020-08-15 LAB — COMPREHENSIVE METABOLIC PANEL
ALT: 11 U/L (ref 0–35)
AST: 16 U/L (ref 0–37)
Albumin: 4 g/dL (ref 3.5–5.2)
Alkaline Phosphatase: 87 U/L (ref 39–117)
BUN: 14 mg/dL (ref 6–23)
CO2: 28 mEq/L (ref 19–32)
Calcium: 9.9 mg/dL (ref 8.4–10.5)
Chloride: 103 mEq/L (ref 96–112)
Creatinine, Ser: 0.96 mg/dL (ref 0.40–1.20)
GFR: 67.3 mL/min (ref >60.00)
Glucose, Bld: 89 mg/dL (ref 70–99)
Potassium: 4.1 mEq/L (ref 3.5–5.1)
Sodium: 138 mEq/L (ref 135–145)
Total Bilirubin: 0.5 mg/dL (ref 0.2–1.2)
Total Protein: 8.1 g/dL (ref 6.0–8.3)

## 2020-08-15 LAB — CBC
HCT: 33.1 % — ABNORMAL LOW (ref 36.0–46.0)
Hemoglobin: 10.8 g/dL — ABNORMAL LOW (ref 12.0–15.0)
MCHC: 32.8 g/dL (ref 30.0–36.0)
MCV: 80.5 fl (ref 78.0–100.0)
Platelets: 365 10*3/uL (ref 150.0–400.0)
RBC: 4.11 Mil/uL (ref 3.87–5.11)
RDW: 17.2 % — ABNORMAL HIGH (ref 11.5–15.5)
WBC: 4.9 10*3/uL (ref 4.0–10.5)

## 2020-08-15 LAB — URIC ACID: Uric Acid, Serum: 8.4 mg/dL — ABNORMAL HIGH (ref 2.4–7.0)

## 2020-08-15 LAB — MICROALBUMIN / CREATININE URINE RATIO
Creatinine,U: 186.9 mg/dL
Microalb Creat Ratio: 0.4 mg/g (ref 0.0–30.0)
Microalb, Ur: 0.8 mg/dL (ref 0.0–1.9)

## 2020-08-15 LAB — HEMOGLOBIN A1C: Hgb A1c MFr Bld: 6.4 % (ref 4.6–6.5)

## 2020-08-15 MED ORDER — AMLODIPINE BESYLATE 5 MG PO TABS: 5.0000 mg | ORAL_TABLET | Freq: Every day | ORAL | 3 refills | Status: DC

## 2020-08-15 MED ORDER — LISINOPRIL 20 MG PO TABS: 20.0000 mg | ORAL_TABLET | Freq: Every day | ORAL | 3 refills | Status: DC

## 2020-08-15 MED ORDER — ATORVASTATIN CALCIUM 40 MG PO TABS: 40.0000 mg | ORAL_TABLET | Freq: Every day | ORAL | 3 refills | Status: DC

## 2020-08-15 MED ORDER — METFORMIN HCL ER 750 MG PO TB24: 750.0000 mg | ORAL_TABLET | Freq: Every day | ORAL | 3 refills | Status: DC

## 2020-08-15 MED ORDER — FLUTICASONE PROPIONATE 50 MCG/ACT NA SUSP: 2.0000 | Freq: Every day | NASAL | 3 refills | Status: DC

## 2020-08-15 NOTE — Assessment & Plan Note (Signed)
Checking uric acid. Not on medication currently. No flare.

## 2020-08-15 NOTE — Assessment & Plan Note (Signed)
Weight is down about 30 pounds in the last year she is working with diet and exercise. Taking metformin. Encouraged continued dedication to diet and exercise.

## 2020-08-15 NOTE — Assessment & Plan Note (Signed)
BP at goal on lisinopril 20 mg daily and amlodipine 5 mg daily. Checking CMP and adjust as needed. Refilled both today.

## 2020-08-15 NOTE — Assessment & Plan Note (Signed)
Foot exam done. Checking HgA1c, lipid panel, microalbumin to creatinine ratio. Eye exam up to date will get records. Taking metformin 750 mg daily. On lisinopril and lipitor. Adjust as needed.

## 2020-08-15 NOTE — Telephone Encounter (Signed)
Error

## 2020-08-15 NOTE — Assessment & Plan Note (Signed)
Checking lipid panel and adjust lipitor 40 mg daily as needed for goal LDL <70. ?

## 2020-08-15 NOTE — Progress Notes (Signed)
   Subjective:   Patient ID: Victoria Hansen, female    DOB: 06-Jun-1965, 55 y.o.   MRN: [id number]  HPI The patient is a new 55 YO female coming in for ongoing care of medical problems. No new problems today. Needs refills on all medications.   PMH, [facility], social history reviewed and updated  Review of Systems  Constitutional: Negative.   HENT: Negative.    Eyes: Negative.   Respiratory:  Negative for cough, chest tightness and shortness of breath.   Cardiovascular:  Negative for chest pain, palpitations and leg swelling.  Gastrointestinal:  Negative for abdominal distention, abdominal pain, constipation, diarrhea, nausea and vomiting.  Musculoskeletal: Negative.   Skin: Negative.   Neurological: Negative.   Psychiatric/Behavioral: Negative.     Objective:  Physical Exam Constitutional:      Appearance: She is well-developed. She is obese.  HENT:     Head: Normocephalic and atraumatic.  Cardiovascular:     Rate and Rhythm: Normal rate and regular rhythm.  Pulmonary:     Effort: Pulmonary effort is normal. No respiratory distress.     Breath sounds: Normal breath sounds. No wheezing or rales.  Abdominal:     General: Bowel sounds are normal. There is no distension.     Palpations: Abdomen is soft.     Tenderness: There is no abdominal tenderness. There is no rebound.  Musculoskeletal:     Cervical back: Normal range of motion.  Skin:    General: Skin is warm and dry.  Neurological:     Mental Status: She is alert and oriented to person, place, and time.     Coordination: Coordination normal.    Vitals:   08/15/20 0947  BP: 128/84  Pulse: 88  Resp: 18  Temp: 98.6 F (37 C)  TempSrc: Oral  SpO2: 99%  Weight: 243 lb 12.8 oz (110.6 kg)  Height: 5\' 1"  (1.549 m)    This visit occurred during the SARS-CoV-2 public health emergency.  Safety protocols were in place, including screening questions prior to the visit, additional usage of staff PPE, and extensive cleaning  of exam room while observing appropriate contact time as indicated for disinfecting solutions.   Assessment & Plan:

## 2020-08-15 NOTE — Patient Instructions (Addendum)
We will check the labs today and see you back in about 6 months.   If the labs looks good now we will plan to leave the metformin the same.

## 2020-10-20 ENCOUNTER — Ambulatory Visit: Payer: BC Managed Care – PPO | Admitting: Internal Medicine

## 2021-02-20 ENCOUNTER — Encounter: Payer: Self-pay | Admitting: Internal Medicine

## 2021-02-20 ENCOUNTER — Other Ambulatory Visit: Payer: Self-pay

## 2021-02-20 ENCOUNTER — Ambulatory Visit (INDEPENDENT_AMBULATORY_CARE_PROVIDER_SITE_OTHER): Payer: BC Managed Care – PPO | Admitting: Internal Medicine

## 2021-02-20 VITALS — BP 126/70 | HR 93 | Resp 18 | Ht 61.0 in | Wt 251.4 lb

## 2021-02-20 DIAGNOSIS — E118 Type 2 diabetes mellitus with unspecified complications: Secondary | ICD-10-CM | POA: Diagnosis not present

## 2021-02-20 DIAGNOSIS — Z1211 Encounter for screening for malignant neoplasm of colon: Secondary | ICD-10-CM | POA: Diagnosis not present

## 2021-02-20 DIAGNOSIS — E1169 Type 2 diabetes mellitus with other specified complication: Secondary | ICD-10-CM | POA: Diagnosis not present

## 2021-02-20 DIAGNOSIS — Z0000 Encounter for general adult medical examination without abnormal findings: Secondary | ICD-10-CM

## 2021-02-20 DIAGNOSIS — E785 Hyperlipidemia, unspecified: Secondary | ICD-10-CM

## 2021-02-20 LAB — LIPID PANEL
Cholesterol: 141 mg/dL (ref 0–200)
HDL: 42.8 mg/dL (ref >39.00)
LDL Cholesterol: 87 mg/dL (ref 0–99)
NonHDL: 98.68
Total CHOL/HDL Ratio: 3
Triglycerides: 56 mg/dL (ref 0.0–149.0)
VLDL: 11.2 mg/dL (ref 0.0–40.0)

## 2021-02-20 LAB — HEMOGLOBIN A1C: Hgb A1c MFr Bld: 6.6 % — ABNORMAL HIGH (ref 4.6–6.5)

## 2021-02-20 NOTE — Patient Instructions (Signed)
We will check the labs today. 

## 2021-02-20 NOTE — Assessment & Plan Note (Signed)
Flu shot up to date. Covid-19 up to date. Pneumonia up to date. Shingrix complete. Tetanus up to date. Colonoscopy referral placed as overdue since 2021. Mammogram with gyn/solis, pap smear with gyn. Counseled about sun safety and mole surveillance. Counseled about the dangers of distracted driving. Given 10 year screening recommendations.

## 2021-02-20 NOTE — Progress Notes (Signed)
° °  Subjective:   Patient ID: Victoria Hansen, female    DOB: 04/24/1965, 56 y.o.   MRN: [id number]  HPI The patient is a 56 YO female coming in for physical.  PMH, [place], social history reviewed and updated  Review of Systems  Constitutional: Negative.   HENT: Negative.    Eyes: Negative.   Respiratory:  Negative for cough, chest tightness and shortness of breath.   Cardiovascular:  Negative for chest pain, palpitations and leg swelling.  Gastrointestinal:  Negative for abdominal distention, abdominal pain, constipation, diarrhea, nausea and vomiting.  Musculoskeletal: Negative.   Skin: Negative.   Neurological: Negative.   Psychiatric/Behavioral: Negative.     Objective:  Physical Exam Constitutional:      Appearance: She is well-developed. She is obese.  HENT:     Head: Normocephalic and atraumatic.  Cardiovascular:     Rate and Rhythm: Normal rate and regular rhythm.  Pulmonary:     Effort: Pulmonary effort is normal. No respiratory distress.     Breath sounds: Normal breath sounds. No wheezing or rales.  Abdominal:     General: Bowel sounds are normal. There is no distension.     Palpations: Abdomen is soft.     Tenderness: There is no abdominal tenderness. There is no rebound.  Musculoskeletal:     Cervical back: Normal range of motion.  Skin:    General: Skin is warm and dry.  Neurological:     Mental Status: She is alert and oriented to person, place, and time.     Coordination: Coordination normal.    Vitals:   02/20/21 0911  BP: 126/70  Pulse: 93  Resp: 18  SpO2: 94%  Weight: 251 lb 6.4 oz (114 kg)  Height: 5\' 1"  (1.549 m)    This visit occurred during the SARS-CoV-2 public health emergency.  Safety protocols were in place, including screening questions prior to the visit, additional usage of staff PPE, and extensive cleaning of exam room while observing appropriate contact time as indicated for disinfecting solutions.   Assessment & Plan:

## 2021-02-20 NOTE — Assessment & Plan Note (Signed)
Checking lipid panel as she was out last time. Adjust lipitor 40 mg daily as needed for goal <100 LDL.

## 2021-02-20 NOTE — Assessment & Plan Note (Signed)
Checking HgA1c and adjust metformin as needed. She is struggling with weight loss but does not want additional medication at this time.

## 2021-06-24 ENCOUNTER — Ambulatory Visit: Payer: BC Managed Care – PPO | Admitting: Internal Medicine

## 2021-08-16 ENCOUNTER — Other Ambulatory Visit: Payer: Self-pay | Admitting: Internal Medicine

## 2021-08-16 DIAGNOSIS — R0981 Nasal congestion: Secondary | ICD-10-CM

## 2021-08-16 DIAGNOSIS — E785 Hyperlipidemia, unspecified: Secondary | ICD-10-CM

## 2021-08-16 DIAGNOSIS — E118 Type 2 diabetes mellitus with unspecified complications: Secondary | ICD-10-CM

## 2021-08-28 ENCOUNTER — Telehealth: Payer: Self-pay | Admitting: Gastroenterology

## 2021-08-28 NOTE — Telephone Encounter (Signed)
PT is calling to reschedule colonoscopy at [facility]. Please advise

## 2021-08-31 ENCOUNTER — Encounter: Payer: Self-pay | Admitting: Internal Medicine

## 2021-08-31 ENCOUNTER — Ambulatory Visit (INDEPENDENT_AMBULATORY_CARE_PROVIDER_SITE_OTHER): Payer: BC Managed Care – PPO | Admitting: Internal Medicine

## 2021-08-31 VITALS — BP 128/88 | HR 84 | Temp 98.7°F | Ht 61.0 in | Wt 251.0 lb

## 2021-08-31 DIAGNOSIS — I10 Essential (primary) hypertension: Secondary | ICD-10-CM | POA: Diagnosis not present

## 2021-08-31 DIAGNOSIS — E785 Hyperlipidemia, unspecified: Secondary | ICD-10-CM

## 2021-08-31 DIAGNOSIS — R059 Cough, unspecified: Secondary | ICD-10-CM | POA: Insufficient documentation

## 2021-08-31 DIAGNOSIS — R051 Acute cough: Secondary | ICD-10-CM

## 2021-08-31 DIAGNOSIS — E118 Type 2 diabetes mellitus with unspecified complications: Secondary | ICD-10-CM | POA: Diagnosis not present

## 2021-08-31 DIAGNOSIS — Z8739 Personal history of other diseases of the musculoskeletal system and connective tissue: Secondary | ICD-10-CM

## 2021-08-31 LAB — COMPREHENSIVE METABOLIC PANEL
ALT: 17 U/L (ref 0–35)
AST: 22 U/L (ref 0–37)
Albumin: 4 g/dL (ref 3.5–5.2)
Alkaline Phosphatase: 108 U/L (ref 39–117)
BUN: 13 mg/dL (ref 6–23)
CO2: 29 mEq/L (ref 19–32)
Calcium: 9.8 mg/dL (ref 8.4–10.5)
Chloride: 102 mEq/L (ref 96–112)
Creatinine, Ser: 0.93 mg/dL (ref 0.40–1.20)
GFR: 69.4 mL/min (ref >60.00)
Glucose, Bld: 97 mg/dL (ref 70–99)
Potassium: 4 mEq/L (ref 3.5–5.1)
Sodium: 139 mEq/L (ref 135–145)
Total Bilirubin: 0.4 mg/dL (ref 0.2–1.2)
Total Protein: 8 g/dL (ref 6.0–8.3)

## 2021-08-31 LAB — CBC
HCT: 34.9 % — ABNORMAL LOW (ref 36.0–46.0)
Hemoglobin: 11.5 g/dL — ABNORMAL LOW (ref 12.0–15.0)
MCHC: 32.9 g/dL (ref 30.0–36.0)
MCV: 84.5 fl (ref 78.0–100.0)
Platelets: 365 10*3/uL (ref 150.0–400.0)
RBC: 4.13 Mil/uL (ref 3.87–5.11)
RDW: 17.3 % — ABNORMAL HIGH (ref 11.5–15.5)
WBC: 6.5 10*3/uL (ref 4.0–10.5)

## 2021-08-31 LAB — MICROALBUMIN / CREATININE URINE RATIO
Creatinine,U: 161.5 mg/dL
Microalb Creat Ratio: 0.5 mg/g (ref 0.0–30.0)
Microalb, Ur: 0.8 mg/dL (ref 0.0–1.9)

## 2021-08-31 LAB — HEMOGLOBIN A1C: Hgb A1c MFr Bld: 6.8 % — ABNORMAL HIGH (ref 4.6–6.5)

## 2021-08-31 LAB — URIC ACID: Uric Acid, Serum: 8 mg/dL — ABNORMAL HIGH (ref 2.4–7.0)

## 2021-08-31 MED ORDER — BENZONATATE 200 MG PO CAPS: 200.0000 mg | ORAL_CAPSULE | Freq: Three times a day (TID) | ORAL | 0 refills | Status: DC | PRN

## 2021-08-31 MED ORDER — LISINOPRIL 20 MG PO TABS: 20.0000 mg | ORAL_TABLET | Freq: Every day | ORAL | 2 refills | Status: DC

## 2021-08-31 MED ORDER — ATORVASTATIN CALCIUM 40 MG PO TABS: ORAL_TABLET | ORAL | 2 refills | Status: DC

## 2021-08-31 MED ORDER — AMLODIPINE BESYLATE 5 MG PO TABS: 5.0000 mg | ORAL_TABLET | Freq: Every day | ORAL | 2 refills | Status: DC

## 2021-08-31 NOTE — Assessment & Plan Note (Signed)
Checking uric acid and adjust as needed. Not on controlling meds currently uses indomethacin for flare only. Adjust as needed.

## 2021-08-31 NOTE — Assessment & Plan Note (Signed)
BP at goal on amlodipine 5 mg daily and lisinopril 20 mg daily. Checking CBC and CMP. Adjust as needed.

## 2021-08-31 NOTE — Assessment & Plan Note (Signed)
Checking microalbumin to creatinine ratio, CMP, HgA1c today. Foot exam done. Adjust metformin 750 mg daily as needed. On statin and ACE-I.

## 2021-08-31 NOTE — Progress Notes (Signed)
   Subjective:   Patient ID: Victoria Hansen, female    DOB: [birth year], 56 y.o.   MRN: [id number]  HPI The patient is a 56 YO female coming in for sick visit and follow up.  Review of Systems  Constitutional: Negative.   HENT: Negative.    Eyes: Negative.   Respiratory:  Positive for cough. Negative for chest tightness and shortness of breath.   Cardiovascular:  Negative for chest pain, palpitations and leg swelling.  Gastrointestinal:  Negative for abdominal distention, abdominal pain, constipation, diarrhea, nausea and vomiting.  Musculoskeletal: Negative.   Skin: Negative.   Neurological: Negative.   Psychiatric/Behavioral: Negative.      Objective:  Physical Exam Constitutional:      Appearance: She is well-developed. She is obese.  HENT:     Head: Normocephalic and atraumatic.  Cardiovascular:     Rate and Rhythm: Normal rate and regular rhythm.  Pulmonary:     Effort: Pulmonary effort is normal. No respiratory distress.     Breath sounds: Normal breath sounds. No wheezing or rales.  Abdominal:     General: Bowel sounds are normal. There is no distension.     Palpations: Abdomen is soft.     Tenderness: There is no abdominal tenderness. There is no rebound.  Musculoskeletal:     Cervical back: Normal range of motion.  Skin:    General: Skin is warm and dry.     Comments: Foot exam done  Neurological:     Mental Status: She is alert and oriented to person, place, and time.     Coordination: Coordination normal.     Vitals:   08/31/21 1023  BP: 128/88  Pulse: 84  Temp: 98.7 F (37.1 C)  TempSrc: Oral  SpO2: 94%  Weight: 251 lb (113.9 kg)  Height: 5\' 1"  (1.549 m)    Assessment & Plan:

## 2021-08-31 NOTE — Telephone Encounter (Signed)
Dr. Myrtie Neither, pt was last seen in the office in 06/2019. She is ready to schedule screening colonoscopy. Would you like to see her in the office or can she be scheduled direct?

## 2021-08-31 NOTE — Assessment & Plan Note (Signed)
Weight stable overall. She is working on lifestyle to help with diet and exercise.

## 2021-08-31 NOTE — Assessment & Plan Note (Signed)
Likely post-viral. Lungs clear. Rx tessalon perles 200 mg TID prn. No other treatment indicated today.

## 2021-08-31 NOTE — Patient Instructions (Signed)
We have sent in the tessalon perles to use up to 3 times a day for cough.

## 2021-09-01 NOTE — Telephone Encounter (Signed)
Shon, will you please contact patient and let her know that Dr. Myrtie Neither would like to see her in the office before we schedule colonoscopy. Thank you

## 2021-09-01 NOTE — Telephone Encounter (Signed)
Have her make a clinic appointment to see me.  In June 2021, her BMI over 50 required the procedure be done in [place].  If that has changed, she may be able to have it in the [facility].  If not, we are booked out several months for hospital-based routine procedures.  -HD

## 2021-09-04 NOTE — Telephone Encounter (Signed)
Patient scheduled for. OV 11/05/21 AT 2:20 PM .

## 2021-09-04 NOTE — Telephone Encounter (Signed)
Called PT to schedule OV, no answer. Left a VM to call and schedule.

## 2021-09-04 NOTE — Telephone Encounter (Signed)
Noted, thanks!

## 2021-10-06 ENCOUNTER — Encounter: Payer: Self-pay | Admitting: Internal Medicine

## 2021-10-15 ENCOUNTER — Other Ambulatory Visit: Payer: Self-pay | Admitting: Internal Medicine

## 2021-10-15 MED ORDER — ALLOPURINOL 300 MG PO TABS: 300.0000 mg | ORAL_TABLET | Freq: Every day | ORAL | 1 refills | Status: DC

## 2021-10-15 MED ORDER — COLCHICINE 0.6 MG PO TABS: 0.6000 mg | ORAL_TABLET | Freq: Every day | ORAL | 0 refills | Status: DC

## 2021-11-05 ENCOUNTER — Encounter: Payer: Self-pay | Admitting: Gastroenterology

## 2021-11-05 ENCOUNTER — Ambulatory Visit: Payer: BC Managed Care – PPO | Admitting: Gastroenterology

## 2021-11-05 VITALS — BP 130/88 | HR 96 | Ht 61.0 in | Wt 255.6 lb

## 2021-11-05 DIAGNOSIS — Z1211 Encounter for screening for malignant neoplasm of colon: Secondary | ICD-10-CM

## 2021-11-05 MED ORDER — NA SULFATE-K SULFATE-MG SULF 17.5-3.13-1.6 GM/177ML PO SOLN: 1.0000 | Freq: Once | ORAL | 0 refills | Status: AC

## 2021-11-05 NOTE — Patient Instructions (Signed)
_______________________________________________________  If you are age 55 or older, your body mass index should be between 23-30. Your Body mass index is 48.3 kg/m. If this is out of the aforementioned range listed, please consider follow up with your Primary Care Provider.  If you are age 40 or younger, your body mass index should be between 19-25. Your Body mass index is 48.3 kg/m. If this is out of the aformentioned range listed, please consider follow up with your Primary Care Provider.   ________________________________________________________  The [facility] providers would like to encourage you to use [facility] to communicate with providers for non-urgent requests or questions.  Due to long hold times on the telephone, sending your provider a message by [facility] may be a faster and more efficient way to get a response.  Please allow 48 business hours for a response.  Please remember that this is for non-urgent requests.  _______________________________________________________  Victoria Hansen have been scheduled for a colonoscopy. Please follow written instructions given to you at your visit today.  Please pick up your prep supplies at the pharmacy within the next 1-3 days. If you use inhalers (even only as needed), please bring them with you on the day of your procedure.  It was a pleasure to see you today!  Thank you for trusting me with your gastrointestinal care!

## 2021-11-05 NOTE — Progress Notes (Addendum)
[facility] progress note:  History: Victoria Hansen 11/05/2021  Referring provider: Hoyt Koch, MD  Reason for consult/chief complaint: Colonoscopy (Pt states need a colon . She states has been loosing weight and want to know if she could have the procedure here in the office.)   Subjective  HPI: [name] was last seen in the office June 2021 for consideration of screening colonoscopy.  Her BMI was about 50, and rather than pursue the procedure in the outpatient hospital-based endoscopy lab, she preferred to wait and continue her diet and lifestyle journey to weight loss in hopes she would not be able to get BMI well below 50 and therefore qualify for procedure in our endoscopy facility.  She is feeling well from a digestive standpoint.  Denies chronic upper digestive symptoms, and bowel habits are regular without rectal bleeding.   ROS:  Review of Systems She denies chest pain dyspnea or dysuria  Recently getting a gout flare.  Past Medical History: Past Medical History:  Diagnosis Date   Diabetes mellitus without complication ([place])    Gout    Hypertension    Obesity      Past Surgical History: Past Surgical History:  Procedure Laterality Date   TUBAL LIGATION       Family History: Family History  Problem Relation Age of Onset   Hypertension Mother    Thyroid disease Mother    Heart disease Father    Hypertension Father    Gout Father    Gout Brother    Colon cancer Neg Hx    Rectal cancer Neg Hx    Esophageal cancer Neg Hx     Social History: Social History   Socioeconomic History   Marital status: Married    Spouse name: Not on file   Number of children: 2   Years of education: Not on file   Highest education level: Not on file  Occupational History   Occupation: retired Pharmacist, hospital  Tobacco Use   Smoking status: Never   Smokeless tobacco: Never  Vaping Use   Vaping Use: Never used  Substance and Sexual  Activity   Alcohol use: Yes    Comment: occ   Drug use: No   Sexual activity: Yes  Other Topics Concern   Not on file  Social History Narrative   Not on file   Social Determinants of Health   Financial Resource Strain: Not on file  Food Insecurity: Not on file  Transportation Needs: Not on file  Physical Activity: Not on file  Stress: Not on file  Social Connections: Not on file    Allergies: No Known Allergies  Outpatient Meds: Current Outpatient Medications  Medication Sig Dispense Refill   allopurinol (ZYLOPRIM) 300 MG tablet Take 1 tablet (300 mg total) by mouth daily. 90 tablet 1   amLODipine (NORVASC) 5 MG tablet Take 1 tablet (5 mg total) by mouth daily. 90 tablet 2   aspirin EC 81 MG tablet Take 81 mg by mouth daily.     atorvastatin (LIPITOR) 40 MG tablet TAKE 1 TABLET(40 MG) BY MOUTH DAILY 90 tablet 2   benzonatate (TESSALON) 200 MG capsule Take 1 capsule (200 mg total) by mouth 3 (three) times daily as needed. 60 capsule 0   Blood Glucose Monitoring Suppl (BLOOD GLUCOSE MONITOR SYSTEM) w/Device KIT 1 Units by Does not apply route daily. 1 each 0   cholecalciferol (VITAMIN D) 1000 units tablet Take 1,000 Units by mouth daily.  colchicine 0.6 MG tablet Take 1 tablet (0.6 mg total) by mouth daily. 30 tablet 0   fluticasone (FLONASE) 50 MCG/ACT nasal spray SHAKE LIQUID AND USE 2 SPRAYS IN EACH NOSTRIL DAILY 48 g 3   glucose blood test strip Use to check blood glucose 1-2 times daily. Dispense type that matches machine. E11. 9 100 each 12   indomethacin (INDOCIN) 50 MG capsule 50 mg 3 times daily; initiate within 24 to 48 hours of flare onset preferably; discontinue 2 to 3 days after resolution of clinical signs; usual duration: 5 to 7 days 30 capsule 1   lisinopril (ZESTRIL) 20 MG tablet Take 1 tablet (20 mg total) by mouth daily. 90 tablet 2   metFORMIN (GLUCOPHAGE-XR) 750 MG 24 hr tablet TAKE 1 TABLET(750 MG) BY MOUTH DAILY WITH BREAKFAST 90 tablet 3   No current  facility-administered medications for this visit.      ___________________________________________________________________ Objective   Exam:  BP 130/88   Pulse 96   Ht '5\' 1"'  (1.549 m)   Wt 255 lb 9.6 oz (115.9 kg)   LMP 10/26/2015 (Exact Date)   SpO2 98%   BMI 48.30 kg/m  Wt Readings from Last 3 Encounters:  11/05/21 255 lb 9.6 oz (115.9 kg)  08/31/21 251 lb (113.9 kg)  02/20/21 251 lb 6.4 oz (114 kg)    General: Well-appearing, gets on exam table without difficulty. Eyes: sclera anicteric, no redness ENT: oral mucosa moist without lesions, no cervical or supraclavicular lymphadenopathy CV: Regular without murmur, no JVD, no peripheral edema Resp: clear to auscultation bilaterally, normal RR and effort noted GI: soft, obese, no tenderness, with active bowel sounds. No guarding or palpable organomegaly noted.   Labs:     Latest Ref Rng & Units 08/31/2021   10:42 AM 08/15/2020   10:13 AM 11/08/2018   11:35 AM  CBC  WBC 4.0 - 10.5 K/uL 6.5  4.9  6.1   Hemoglobin 12.0 - 15.0 g/dL 11.5  10.8  11.8   Hematocrit 36.0 - 46.0 % 34.9  33.1  35.8   Platelets 150.0 - 400.0 K/uL 365.0  365.0  357       Latest Ref Rng & Units 08/31/2021   10:42 AM 08/15/2020   10:13 AM 05/23/2019    4:11 PM  CMP  Glucose 70 - 99 mg/dL 97  89  87   BUN 6 - 23 mg/dL '13  14  14   ' Creatinine 0.40 - 1.20 mg/dL 0.93  0.96  0.79   Sodium 135 - 145 mEq/L 139  138  141   Potassium 3.5 - 5.1 mEq/L 4.0  4.1  4.5   Chloride 96 - 112 mEq/L 102  103  102   CO2 19 - 32 mEq/L '29  28  22   ' Calcium 8.4 - 10.5 mg/dL 9.8  9.9  9.9   Total Protein 6.0 - 8.3 g/dL 8.0  8.1  8.0   Total Bilirubin 0.2 - 1.2 mg/dL 0.4  0.5  0.4   Alkaline Phos 39 - 117 U/L 108  87  149   AST 0 - 37 U/L '22  16  20   ' ALT 0 - 35 U/L '17  11  15     ' Assessment: Encounter Diagnosis  Name Primary?   Special screening for malignant neoplasms, colon Yes    Average risk for colorectal cancer, screening colonoscopy recommended.  She  was agreeable for discussion of procedure and risks.  The benefits and risks of  the planned procedure were described in detail with the patient or (when appropriate) their health care proxy.  Risks were outlined as including, but not limited to, bleeding, infection, perforation, adverse medication reaction leading to cardiac or pulmonary decompensation, pancreatitis (if ERCP).  The limitation of incomplete mucosal visualization was also discussed.  No guarantees or warranties were given.  Patient at increased risk for cardiopulmonary complications of procedure due to medical comorbidities.  BMI under 50 with good overall health, appropriate for procedure in our outpatient endoscopy lab.  Thank you for the courtesy of this consult.  Please call me with any questions or concerns.  Nelida Meuse III

## 2021-11-10 ENCOUNTER — Encounter: Payer: BC Managed Care – PPO | Admitting: Gastroenterology

## 2021-12-15 ENCOUNTER — Encounter: Payer: BC Managed Care – PPO | Admitting: Gastroenterology

## 2022-03-09 ENCOUNTER — Encounter: Payer: Self-pay | Admitting: Gastroenterology

## 2022-03-09 ENCOUNTER — Ambulatory Visit (AMBULATORY_SURGERY_CENTER): Payer: BC Managed Care – PPO

## 2022-03-09 VITALS — Ht 61.0 in | Wt 258.0 lb

## 2022-03-09 DIAGNOSIS — Z1211 Encounter for screening for malignant neoplasm of colon: Secondary | ICD-10-CM

## 2022-03-09 NOTE — Progress Notes (Signed)
No egg or soy allergy known to patient   No issues known to pt with past sedation with any surgeries or procedures  Patient denies ever being told they had issues or difficulty with intubation   No FH of Malignant Hyperthermia  Pt is not on diet pills  Pt is not on  home 02   Pt is not on blood thinners   Pt denies issues with constipation   No A fib or A flutter  Have any cardiac testing pending--no    Has prep at home

## 2022-03-23 ENCOUNTER — Encounter: Payer: Self-pay | Admitting: Gastroenterology

## 2022-03-23 ENCOUNTER — Ambulatory Visit (AMBULATORY_SURGERY_CENTER): Payer: BC Managed Care – PPO | Admitting: Gastroenterology

## 2022-03-23 VITALS — BP 108/67 | HR 74 | Temp 98.0°F | Resp 14 | Ht 61.0 in | Wt 258.0 lb

## 2022-03-23 DIAGNOSIS — Z1211 Encounter for screening for malignant neoplasm of colon: Secondary | ICD-10-CM | POA: Diagnosis not present

## 2022-03-23 MED ORDER — SODIUM CHLORIDE 0.9 % IV SOLN: 500.0000 mL | Freq: Once | INTRAVENOUS | Status: DC

## 2022-03-23 NOTE — Progress Notes (Signed)
Pt's states no medical or surgical changes since previsit or office visit. 

## 2022-03-23 NOTE — Patient Instructions (Addendum)
Recommendation:           - Patient has a contact number available for                            emergencies. The signs and symptoms of potential                            delayed complications were discussed with the                            patient. Return to normal activities tomorrow.                            Written discharge instructions were provided to the                            patient.                           - Resume previous diet.                           - Continue present medications.                           - Repeat colonoscopy in 10 years for screening                            purposes. (consume more water with prep for next                            exam)  YOU HAD AN ENDOSCOPIC PROCEDURE TODAY AT [facility]:   Refer to the procedure report that was given to you for any specific questions about what was found during the examination.  If the procedure report does not answer your questions, please call your gastroenterologist to clarify.  If you requested that your care partner not be given the details of your procedure findings, then the procedure report has been included in a sealed envelope for you to review at your convenience later.  YOU SHOULD EXPECT: Some feelings of bloating in the abdomen. Passage of more gas than usual.  Walking can help get rid of the air that was put into your GI tract during the procedure and reduce the bloating. If you had a lower endoscopy (such as a colonoscopy or flexible sigmoidoscopy) you may notice spotting of blood in your stool or on the toilet paper. If you underwent a bowel prep for your procedure, you may not have a normal bowel movement for a few days.  Please Note:  You might notice some irritation and congestion in your nose or some drainage.  This is from the oxygen used during your procedure.  There is no need for concern and it should clear up in a day or so.  SYMPTOMS TO REPORT  IMMEDIATELY:  Following lower endoscopy (colonoscopy or flexible sigmoidoscopy):  Excessive amounts of blood in the stool  Significant tenderness or worsening of abdominal pains  Swelling of the abdomen that is new, acute  Fever of 100F  or higher  Handout on diverticulosis given,  For urgent or emergent issues, a gastroenterologist can be reached at any hour by calling [phone]. Do not use MyChart messaging for urgent concerns.    DIET:  We do recommend a small meal at first, but then you may proceed to your regular diet.  Drink plenty of fluids but you should avoid alcoholic beverages for 24 hours.  ACTIVITY:  You should plan to take it easy for the rest of today and you should NOT DRIVE or use heavy machinery until tomorrow (because of the sedation medicines used during the test).    FOLLOW UP: Our staff will call the number listed on your records the next business day following your procedure.  We will call around 7:15- 8:00 am to check on you and address any questions or concerns that you may have regarding the information given to you following your procedure. If we do not reach you, we will leave a message.     If any biopsies were taken you will be contacted by phone or by letter within the next 1-3 weeks.  Please call us at [phone] if you have not heard about the biopsies in 3 weeks.    SIGNATURES/CONFIDENTIALITY: You and/or your care partner have signed paperwork which will be entered into your electronic medical record.  These signatures attest to the fact that that the information above on your After Visit Summary has been reviewed and is understood.  Full responsibility of the confidentiality of this discharge information lies with you and/or your care-partner.

## 2022-03-23 NOTE — Op Note (Signed)
[place] Patient Name: Victoria Hansen Procedure Date: 03/23/2022 9:53 AM MRN: [id number] Endoscopist: Mallie Mussel L. Loletha Carrow , MD, [id number] Age: 57 Referring MD:  Date of Birth: 1965/07/24 Gender: Female Account #: [id number] Procedure:                Colonoscopy Indications:              Screening for colorectal malignant neoplasm, This                            is the patient's first colonoscopy Medicines:                Monitored Anesthesia Care Procedure:                Pre-Anesthesia Assessment:                           - Prior to the procedure, a History and Physical                            was performed, and patient medications and                            allergies were reviewed. The patient's tolerance of                            previous anesthesia was also reviewed. The risks                            and benefits of the procedure and the sedation                            options and risks were discussed with the patient.                            All questions were answered, and informed consent                            was obtained. Prior Anticoagulants: The patient has                            taken no anticoagulant or antiplatelet agents. ASA                            Grade Assessment: III - A patient with severe                            systemic disease. After reviewing the risks and                            benefits, the patient was deemed in satisfactory                            condition to undergo the procedure.  After obtaining informed consent, the colonoscope                            was passed under direct vision. Throughout the                            procedure, the patient's blood pressure, pulse, and                            oxygen saturations were monitored continuously. The                            Olympus CF-HQ190L [id number]) Colonoscope was                            introduced through  the anus and advanced to the the                            cecum, identified by appendiceal orifice and                            ileocecal valve. The colonoscopy was performed                            without difficulty. The patient tolerated the                            procedure well. The quality of the bowel                            preparation was good after lavage. The ileocecal                            valve, appendiceal orifice, and rectum were                            photographed. The bowel preparation used was SUPREP                            via split dose instruction. Scope In: [id number] AM Scope Out: 10:07:41 AM Scope Withdrawal Time: 0 hours 8 minutes 1 second  Total Procedure Duration: 0 hours 10 minutes 53 seconds  Findings:                 The perianal and digital rectal examinations were                            normal.                           Repeat examination of right colon under NBI                            performed.  Multiple diverticula were found in the left colon                            and right colon.                           The exam was otherwise without abnormality on                            direct and retroflexion views. Complications:            No immediate complications. Estimated Blood Loss:     Estimated blood loss: none. Impression:               - Diverticulosis in the left colon and in the right                            colon.                           - The examination was otherwise normal on direct                            and retroflexion views.                           - No specimens collected. Recommendation:           - Patient has a contact number available for                            emergencies. The signs and symptoms of potential                            delayed complications were discussed with the                            patient. Return to normal activities tomorrow.                             Written discharge instructions were provided to the                            patient.                           - Resume previous diet.                           - Continue present medications.                           - Repeat colonoscopy in 10 years for screening                            purposes. (consume more water with prep for next  exam) Mallie Mussel L. Loletha Carrow, MD 03/23/2022 10:11:59 AM This report has been signed electronically.

## 2022-03-23 NOTE — Progress Notes (Signed)
A and O x3. Report to RN. Tolerated MAC anesthesia well. 

## 2022-03-23 NOTE — Progress Notes (Signed)
History and Physical:  This patient presents for endoscopic testing for: Encounter Diagnosis  Name Primary?   Special screening for malignant neoplasms, colon Yes    Average risk for CRC - first screening exam Patient denies chronic abdominal pain, rectal bleeding, constipation or diarrhea.   Patient is otherwise without complaints or active issues today.   Past Medical History: Past Medical History:  Diagnosis Date   Allergy    seasonal   Diabetes mellitus without complication ([place])    Gout    Hypertension    Obesity      Past Surgical History: Past Surgical History:  Procedure Laterality Date   TUBAL LIGATION  2008    Allergies: No Known Allergies  Outpatient Meds: Current Outpatient Medications  Medication Sig Dispense Refill   amLODipine (NORVASC) 5 MG tablet Take 1 tablet (5 mg total) by mouth daily. 90 tablet 2   aspirin EC 81 MG tablet Take 81 mg by mouth daily.     atorvastatin (LIPITOR) 40 MG tablet TAKE 1 TABLET(40 MG) BY MOUTH DAILY 90 tablet 2   Blood Glucose Monitoring Suppl (BLOOD GLUCOSE MONITOR SYSTEM) w/Device KIT 1 Units by Does not apply route daily. 1 each 0   colchicine 0.6 MG tablet Take 1 tablet (0.6 mg total) by mouth daily. 30 tablet 0   glucose blood test strip Use to check blood glucose 1-2 times daily. Dispense type that matches machine. E11. 9 100 each 12   lisinopril (ZESTRIL) 20 MG tablet Take 1 tablet (20 mg total) by mouth daily. 90 tablet 2   metFORMIN (GLUCOPHAGE-XR) 750 MG 24 hr tablet TAKE 1 TABLET(750 MG) BY MOUTH DAILY WITH BREAKFAST 90 tablet 3   allopurinol (ZYLOPRIM) 300 MG tablet Take 1 tablet (300 mg total) by mouth daily. 90 tablet 1   benzonatate (TESSALON) 200 MG capsule Take 1 capsule (200 mg total) by mouth 3 (three) times daily as needed. 60 capsule 0   fluticasone (FLONASE) 50 MCG/ACT nasal spray SHAKE LIQUID AND USE 2 SPRAYS IN EACH NOSTRIL DAILY 48 g 3   indomethacin (INDOCIN) 50 MG capsule 50 mg 3 times daily;  initiate within 24 to 48 hours of flare onset preferably; discontinue 2 to 3 days after resolution of clinical signs; usual duration: 5 to 7 days 30 capsule 1   Current Facility-Administered Medications  Medication Dose Route Frequency Provider Last Rate Last Admin   0.9 %  sodium chloride infusion  500 mL Intravenous Once Danis, Kirke Corin, MD          ___________________________________________________________________ Objective   Exam:  BP 134/82   Pulse 98   Temp 98 F (36.7 C) (Temporal)   Ht '5\' 1"'$  (1.549 m)   Wt 258 lb (117 kg)   LMP 10/26/2015 (Exact Date)   SpO2 96%   BMI 48.75 kg/m   CV: regular , S1/S2 Resp: clear to auscultation bilaterally, normal RR and effort noted GI: soft, no tenderness, with active bowel sounds.   Assessment: Encounter Diagnosis  Name Primary?   Special screening for malignant neoplasms, colon Yes     Plan: Colonoscopy  The benefits and risks of the planned procedure were described in detail with the patient or (when appropriate) their health care proxy.  Risks were outlined as including, but not limited to, bleeding, infection, perforation, adverse medication reaction leading to cardiac or pulmonary decompensation, pancreatitis (if ERCP).  The limitation of incomplete mucosal visualization was also discussed.  No guarantees or warranties were given.  The patient is appropriate for an endoscopic procedure in the ambulatory setting.   - Wilfrid Lund, MD

## 2022-03-24 ENCOUNTER — Telehealth: Payer: Self-pay | Admitting: *Deleted

## 2022-03-24 NOTE — Telephone Encounter (Signed)
Post procedure follow up phone call. No answer at number given.  Left message on voicemail.  

## 2022-03-30 LAB — HM MAMMOGRAPHY

## 2022-04-06 ENCOUNTER — Encounter: Payer: Self-pay | Admitting: Internal Medicine

## 2022-04-07 ENCOUNTER — Other Ambulatory Visit: Payer: Self-pay | Admitting: Internal Medicine

## 2022-05-26 ENCOUNTER — Other Ambulatory Visit: Payer: Self-pay | Admitting: Internal Medicine

## 2022-05-28 ENCOUNTER — Ambulatory Visit: Payer: BC Managed Care – PPO | Admitting: Internal Medicine

## 2022-05-28 VITALS — BP 140/80 | HR 82 | Temp 98.6°F | Ht 61.0 in | Wt 255.0 lb

## 2022-05-28 DIAGNOSIS — E118 Type 2 diabetes mellitus with unspecified complications: Secondary | ICD-10-CM

## 2022-05-28 DIAGNOSIS — I10 Essential (primary) hypertension: Secondary | ICD-10-CM

## 2022-05-28 DIAGNOSIS — R0981 Nasal congestion: Secondary | ICD-10-CM

## 2022-05-28 DIAGNOSIS — E785 Hyperlipidemia, unspecified: Secondary | ICD-10-CM

## 2022-05-28 DIAGNOSIS — Z0000 Encounter for general adult medical examination without abnormal findings: Secondary | ICD-10-CM | POA: Diagnosis not present

## 2022-05-28 DIAGNOSIS — Z8739 Personal history of other diseases of the musculoskeletal system and connective tissue: Secondary | ICD-10-CM | POA: Diagnosis not present

## 2022-05-28 DIAGNOSIS — Z7984 Long term (current) use of oral hypoglycemic drugs: Secondary | ICD-10-CM

## 2022-05-28 DIAGNOSIS — E1169 Type 2 diabetes mellitus with other specified complication: Secondary | ICD-10-CM

## 2022-05-28 LAB — CBC
HCT: 35.2 % — ABNORMAL LOW (ref 36.0–46.0)
Hemoglobin: 11.4 g/dL — ABNORMAL LOW (ref 12.0–15.0)
MCHC: 32.5 g/dL (ref 30.0–36.0)
MCV: 80.2 fl (ref 78.0–100.0)
Platelets: 389 10*3/uL (ref 150.0–400.0)
RBC: 4.39 Mil/uL (ref 3.87–5.11)
RDW: 17.7 % — ABNORMAL HIGH (ref 11.5–15.5)
WBC: 6.7 10*3/uL (ref 4.0–10.5)

## 2022-05-28 LAB — COMPREHENSIVE METABOLIC PANEL
ALT: 15 U/L (ref 0–35)
AST: 19 U/L (ref 0–37)
Albumin: 3.9 g/dL (ref 3.5–5.2)
Alkaline Phosphatase: 129 U/L — ABNORMAL HIGH (ref 39–117)
BUN: 17 mg/dL (ref 6–23)
CO2: 29 mEq/L (ref 19–32)
Calcium: 10 mg/dL (ref 8.4–10.5)
Chloride: 101 mEq/L (ref 96–112)
Creatinine, Ser: 0.99 mg/dL (ref 0.40–1.20)
GFR: 64.05 mL/min (ref >60.00)
Glucose, Bld: 98 mg/dL (ref 70–99)
Potassium: 4 mEq/L (ref 3.5–5.1)
Sodium: 138 mEq/L (ref 135–145)
Total Bilirubin: 0.5 mg/dL (ref 0.2–1.2)
Total Protein: 8.1 g/dL (ref 6.0–8.3)

## 2022-05-28 LAB — MICROALBUMIN / CREATININE URINE RATIO
Creatinine,U: 124.7 mg/dL
Microalb Creat Ratio: 0.6 mg/g (ref 0.0–30.0)
Microalb, Ur: <0.7 mg/dL (ref 0.0–1.9)

## 2022-05-28 LAB — URIC ACID: Uric Acid, Serum: 6.1 mg/dL (ref 2.4–7.0)

## 2022-05-28 LAB — LIPID PANEL
Cholesterol: 151 mg/dL (ref 0–200)
HDL: 40.7 mg/dL (ref >39.00)
LDL Cholesterol: 97 mg/dL (ref 0–99)
NonHDL: 109.96
Total CHOL/HDL Ratio: 4
Triglycerides: 63 mg/dL (ref 0.0–149.0)
VLDL: 12.6 mg/dL (ref 0.0–40.0)

## 2022-05-28 LAB — HEMOGLOBIN A1C: Hgb A1c MFr Bld: 6.8 % — ABNORMAL HIGH (ref 4.6–6.5)

## 2022-05-28 MED ORDER — ALLOPURINOL 300 MG PO TABS: 300.0000 mg | ORAL_TABLET | Freq: Every day | ORAL | 3 refills | Status: DC

## 2022-05-28 MED ORDER — AMLODIPINE BESYLATE 5 MG PO TABS: 5.0000 mg | ORAL_TABLET | Freq: Every day | ORAL | 3 refills | Status: DC

## 2022-05-28 MED ORDER — FLUTICASONE PROPIONATE 50 MCG/ACT NA SUSP
2.0000 | Freq: Every day | NASAL | 3 refills | Status: AC
Start: 2022-05-28 — End: ?

## 2022-05-28 MED ORDER — ATORVASTATIN CALCIUM 40 MG PO TABS: 40.0000 mg | ORAL_TABLET | Freq: Every day | ORAL | 3 refills | Status: DC

## 2022-05-28 MED ORDER — LISINOPRIL 20 MG PO TABS: 20.0000 mg | ORAL_TABLET | Freq: Every day | ORAL | 3 refills | Status: DC

## 2022-05-28 NOTE — Assessment & Plan Note (Signed)
Weight stable to mildly up. Not exercising lately counseled about diet and exercise.

## 2022-05-28 NOTE — Patient Instructions (Signed)
We will check the labs today and have refilled the medicines. ° ° °

## 2022-05-28 NOTE — Assessment & Plan Note (Signed)
Flu shot yearly. Shingrix complete. Tetanus up to date. Colonoscopy up to date. Mammogram up to date, pap smear up to date. Counseled about sun safety and mole surveillance. Counseled about the dangers of distracted driving. Given 10 year screening recommendations.  

## 2022-05-28 NOTE — Assessment & Plan Note (Signed)
Checking lipid panel and adjust lipitor 40 mg daily as needed for LDL goal <100.  ?

## 2022-05-28 NOTE — Progress Notes (Signed)
   Subjective:   Patient ID: Victoria Hansen, female    DOB: 1965-02-13, 57 y.o.   MRN: [id number]  HPI The patient is here for physical.  PMH, [facility], social history reviewed and updated  Review of Systems  Constitutional: Negative.   HENT: Negative.    Eyes: Negative.   Respiratory:  Negative for cough, chest tightness and shortness of breath.   Cardiovascular:  Negative for chest pain, palpitations and leg swelling.  Gastrointestinal:  Negative for abdominal distention, abdominal pain, constipation, diarrhea, nausea and vomiting.  Musculoskeletal: Negative.   Skin: Negative.   Neurological: Negative.   Psychiatric/Behavioral: Negative.      Objective:  Physical Exam Constitutional:      Appearance: She is well-developed.  HENT:     Head: Normocephalic and atraumatic.  Cardiovascular:     Rate and Rhythm: Normal rate and regular rhythm.  Pulmonary:     Effort: Pulmonary effort is normal. No respiratory distress.     Breath sounds: Normal breath sounds. No wheezing or rales.  Abdominal:     General: Bowel sounds are normal. There is no distension.     Palpations: Abdomen is soft.     Tenderness: There is no abdominal tenderness. There is no rebound.  Musculoskeletal:     Cervical back: Normal range of motion.  Skin:    General: Skin is warm and dry.     Comments: Foot exam  Neurological:     Mental Status: She is alert and oriented to person, place, and time.     Coordination: Coordination normal.     Vitals:   05/28/22 1548 05/28/22 1554  BP: (!) 120/98 (!) 120/98  Pulse: 82   Temp: 98.6 F (37 C)   TempSrc: Oral   SpO2: 99%   Weight: 255 lb (115.7 kg)   Height: 5\' 1"  (1.549 m)     Assessment & Plan:

## 2022-05-28 NOTE — Assessment & Plan Note (Signed)
Checking uric acid and adjust allopurinol 300 mg daily as needed for goal <6. No flare today or recently.

## 2022-05-28 NOTE — Assessment & Plan Note (Signed)
Checking lipid panel and HgA1c and microalbumin to creatinine ratio and CMP. Adjust metformin 750 mg daily as needed. On ACE-I and statin. Foot exam done and eye exam up to date.

## 2022-05-28 NOTE — Assessment & Plan Note (Signed)
BP elevated today but normal at home earlier. Checking CMP and adjust as needed. For now continue amlodipine 5 mg daily and lisinopril 20 mg daily and she will call with readings.

## 2022-08-26 ENCOUNTER — Other Ambulatory Visit: Payer: Self-pay | Admitting: Internal Medicine

## 2022-08-26 DIAGNOSIS — E118 Type 2 diabetes mellitus with unspecified complications: Secondary | ICD-10-CM

## 2022-12-01 ENCOUNTER — Ambulatory Visit: Payer: BC Managed Care – PPO | Admitting: Internal Medicine

## 2022-12-01 ENCOUNTER — Encounter: Payer: Self-pay | Admitting: Internal Medicine

## 2022-12-01 VITALS — BP 118/80 | HR 89 | Temp 98.5°F | Ht 61.0 in | Wt 259.0 lb

## 2022-12-01 DIAGNOSIS — G8929 Other chronic pain: Secondary | ICD-10-CM | POA: Diagnosis not present

## 2022-12-01 DIAGNOSIS — M25512 Pain in left shoulder: Secondary | ICD-10-CM | POA: Diagnosis not present

## 2022-12-01 DIAGNOSIS — Z23 Encounter for immunization: Secondary | ICD-10-CM | POA: Diagnosis not present

## 2022-12-01 MED ORDER — PREDNISONE 20 MG PO TABS: 40.0000 mg | ORAL_TABLET | Freq: Every day | ORAL | 0 refills | Status: DC

## 2022-12-01 NOTE — Assessment & Plan Note (Signed)
Rx 5 day course prednisone and given stretching exercises. Suspect some degree of arthritis and frozen shoulder. If this does not help will refer to sports medicine.

## 2022-12-01 NOTE — Progress Notes (Signed)
   Subjective:   Patient ID: Victoria Hansen, female    DOB: 06-12-1965, 57 y.o.   MRN: [id number]  Arm Pain  Pertinent negatives include no chest pain.   The patient is a 57 YO female coming in for left shoulder pain months and worsening last month or so. Taking tylenol and ibuprofen otc as needed which helps. ROM is limited and worsening. No numbness or weakness.   Review of Systems  Constitutional: Negative.   HENT: Negative.    Eyes: Negative.   Respiratory:  Negative for cough, chest tightness and shortness of breath.   Cardiovascular:  Negative for chest pain, palpitations and leg swelling.  Gastrointestinal:  Negative for abdominal distention, abdominal pain, constipation, diarrhea, nausea and vomiting.  Musculoskeletal:  Positive for arthralgias and myalgias.  Skin: Negative.   Neurological: Negative.   Psychiatric/Behavioral: Negative.      Objective:  Physical Exam Constitutional:      Appearance: She is well-developed.  HENT:     Head: Normocephalic and atraumatic.  Cardiovascular:     Rate and Rhythm: Normal rate and regular rhythm.  Pulmonary:     Effort: Pulmonary effort is normal. No respiratory distress.     Breath sounds: Normal breath sounds. No wheezing or rales.  Abdominal:     General: Bowel sounds are normal. There is no distension.     Palpations: Abdomen is soft.     Tenderness: There is no abdominal tenderness. There is no rebound.  Musculoskeletal:        General: Tenderness present.     Cervical back: Normal range of motion.     Comments: Pain AC joint and limitation of ROM in all directions.   Skin:    General: Skin is warm and dry.  Neurological:     Mental Status: She is alert and oriented to person, place, and time.     Coordination: Coordination normal.     Vitals:   12/01/22 0911  BP: 118/80  Pulse: 89  Temp: 98.5 F (36.9 C)  TempSrc: Oral  SpO2: 100%  Weight: 259 lb (117.5 kg)  Height: 5\' 1"  (1.549 m)    Assessment &  Plan:  Flu shot given at visit

## 2023-02-17 ENCOUNTER — Other Ambulatory Visit: Payer: Self-pay | Admitting: Internal Medicine

## 2023-02-17 DIAGNOSIS — E118 Type 2 diabetes mellitus with unspecified complications: Secondary | ICD-10-CM

## 2023-04-03 ENCOUNTER — Encounter: Payer: Self-pay | Admitting: Internal Medicine

## 2023-04-03 DIAGNOSIS — G8929 Other chronic pain: Secondary | ICD-10-CM

## 2023-04-04 NOTE — Telephone Encounter (Signed)
 Ok to place sports med referral for pt arm? Was seen 11/20 regarding chronic left shoulder pain and not getting any better

## 2023-04-05 LAB — HM MAMMOGRAPHY

## 2023-04-05 NOTE — Progress Notes (Unsigned)
    Aleen Sells D.[facility] [address] [place] [address] Phone: [phone]   Assessment and Plan:     There are no diagnoses linked to this encounter.  ***   Pertinent previous records reviewed include ***    Follow Up: ***     Subjective:   I, Shedrick Sarli, am serving as a Neurosurgeon for Doctor Richardean Sale  Chief Complaint: left shoulder pain   HPI:   04/06/2023 Patient is a 58 year old female with left shoulder pain. Patient states  Relevant Historical Information: ***  Additional pertinent review of systems negative.   Current Outpatient Medications:    allopurinol (ZYLOPRIM) 300 MG tablet, Take 1 tablet (300 mg total) by mouth daily., Disp: 90 tablet, Rfl: 3   amLODipine (NORVASC) 5 MG tablet, Take 1 tablet (5 mg total) by mouth daily., Disp: 90 tablet, Rfl: 3   aspirin EC 81 MG tablet, Take 81 mg by mouth daily., Disp: , Rfl:    atorvastatin (LIPITOR) 40 MG tablet, Take 1 tablet (40 mg total) by mouth daily., Disp: 90 tablet, Rfl: 3   benzonatate (TESSALON) 200 MG capsule, Take 1 capsule (200 mg total) by mouth 3 (three) times daily as needed., Disp: 60 capsule, Rfl: 0   Blood Glucose Monitoring Suppl (BLOOD GLUCOSE MONITOR SYSTEM) w/Device KIT, 1 Units by Does not apply route daily., Disp: 1 each, Rfl: 0   fluticasone (FLONASE) 50 MCG/ACT nasal spray, Place 2 sprays into both nostrils daily., Disp: 48 g, Rfl: 3   glucose blood test strip, Use to check blood glucose 1-2 times daily. Dispense type that matches machine. E11. 9, Disp: 100 each, Rfl: 12   indomethacin (INDOCIN) 50 MG capsule, 50 mg 3 times daily; initiate within 24 to 48 hours of flare onset preferably; discontinue 2 to 3 days after resolution of clinical signs; usual duration: 5 to 7 days, Disp: 30 capsule, Rfl: 1   lisinopril (ZESTRIL) 20 MG tablet, Take 1 tablet (20 mg total) by mouth daily., Disp: 90 tablet, Rfl: 3   metFORMIN (GLUCOPHAGE-XR) 750 MG 24 hr  tablet, TAKE 1 TABLET(750 MG) BY MOUTH DAILY WITH BREAKFAST, Disp: 90 tablet, Rfl: 1   predniSONE (DELTASONE) 20 MG tablet, Take 2 tablets (40 mg total) by mouth daily with breakfast., Disp: 10 tablet, Rfl: 0   Objective:     There were no vitals filed for this visit.    There is no height or weight on file to calculate BMI.    Physical Exam:    ***   Electronically signed by:  Aleen Sells D.[facility] 7:37 AM 04/05/23

## 2023-04-06 ENCOUNTER — Ambulatory Visit (INDEPENDENT_AMBULATORY_CARE_PROVIDER_SITE_OTHER)

## 2023-04-06 ENCOUNTER — Ambulatory Visit: Payer: Self-pay | Admitting: Sports Medicine

## 2023-04-06 VITALS — HR 100 | Ht 61.0 in | Wt 262.0 lb

## 2023-04-06 DIAGNOSIS — G8929 Other chronic pain: Secondary | ICD-10-CM

## 2023-04-06 DIAGNOSIS — M25512 Pain in left shoulder: Secondary | ICD-10-CM | POA: Diagnosis not present

## 2023-04-06 MED ORDER — MELOXICAM 15 MG PO TABS: 15.0000 mg | ORAL_TABLET | Freq: Every day | ORAL | 0 refills | Status: AC

## 2023-04-06 NOTE — Patient Instructions (Addendum)
`-   Start meloxicam 15 mg daily x2 weeks.  If still having pain after 2 weeks, complete 3rd-week of NSAID. May use remaining NSAID as needed once daily for pain control.  Do not to use additional over-the-counter NSAIDs (ibuprofen, naproxen, Advil, Aleve, etc.) while taking prescription NSAIDs.  May use Tylenol [phone] mg 2 to 3 times a day for breakthrough pain. Shoulder HEP  4 week follow up

## 2023-04-07 ENCOUNTER — Encounter: Payer: Self-pay | Admitting: Internal Medicine

## 2023-04-15 ENCOUNTER — Encounter: Payer: Self-pay | Admitting: Sports Medicine

## 2023-05-04 ENCOUNTER — Ambulatory Visit: Admitting: Sports Medicine

## 2023-05-19 ENCOUNTER — Other Ambulatory Visit: Payer: Self-pay | Admitting: Internal Medicine

## 2023-05-19 DIAGNOSIS — Z8739 Personal history of other diseases of the musculoskeletal system and connective tissue: Secondary | ICD-10-CM

## 2023-05-19 DIAGNOSIS — I10 Essential (primary) hypertension: Secondary | ICD-10-CM

## 2023-05-27 ENCOUNTER — Encounter: Payer: Self-pay | Admitting: Internal Medicine

## 2023-05-27 ENCOUNTER — Ambulatory Visit (INDEPENDENT_AMBULATORY_CARE_PROVIDER_SITE_OTHER): Admitting: Internal Medicine

## 2023-05-27 VITALS — BP 132/82 | HR 96 | Temp 98.2°F | Ht 61.0 in | Wt 259.0 lb

## 2023-05-27 DIAGNOSIS — E1169 Type 2 diabetes mellitus with other specified complication: Secondary | ICD-10-CM | POA: Diagnosis not present

## 2023-05-27 DIAGNOSIS — Z0000 Encounter for general adult medical examination without abnormal findings: Secondary | ICD-10-CM

## 2023-05-27 DIAGNOSIS — E785 Hyperlipidemia, unspecified: Secondary | ICD-10-CM

## 2023-05-27 DIAGNOSIS — I10 Essential (primary) hypertension: Secondary | ICD-10-CM

## 2023-05-27 DIAGNOSIS — E118 Type 2 diabetes mellitus with unspecified complications: Secondary | ICD-10-CM

## 2023-05-27 DIAGNOSIS — Z23 Encounter for immunization: Secondary | ICD-10-CM | POA: Diagnosis not present

## 2023-05-27 DIAGNOSIS — Z8739 Personal history of other diseases of the musculoskeletal system and connective tissue: Secondary | ICD-10-CM | POA: Diagnosis not present

## 2023-05-27 LAB — COMPREHENSIVE METABOLIC PANEL WITH GFR
ALT: 14 U/L (ref 0–35)
AST: 16 U/L (ref 0–37)
Albumin: 4.1 g/dL (ref 3.5–5.2)
Alkaline Phosphatase: 125 U/L — ABNORMAL HIGH (ref 39–117)
BUN: 16 mg/dL (ref 6–23)
CO2: 29 meq/L (ref 19–32)
Calcium: 9.9 mg/dL (ref 8.4–10.5)
Chloride: 102 meq/L (ref 96–112)
Creatinine, Ser: 0.9 mg/dL (ref 0.40–1.20)
GFR: 71.31 mL/min (ref >60.00)
Glucose, Bld: 95 mg/dL (ref 70–99)
Potassium: 4.4 meq/L (ref 3.5–5.1)
Sodium: 140 meq/L (ref 135–145)
Total Bilirubin: 0.5 mg/dL (ref 0.2–1.2)
Total Protein: 7.8 g/dL (ref 6.0–8.3)

## 2023-05-27 LAB — CBC
HCT: 34.1 % — ABNORMAL LOW (ref 36.0–46.0)
Hemoglobin: 11.1 g/dL — ABNORMAL LOW (ref 12.0–15.0)
MCHC: 32.5 g/dL (ref 30.0–36.0)
MCV: 79.8 fl (ref 78.0–100.0)
Platelets: 362 10*3/uL (ref 150.0–400.0)
RBC: 4.27 Mil/uL (ref 3.87–5.11)
RDW: 17.8 % — ABNORMAL HIGH (ref 11.5–15.5)
WBC: 5.9 10*3/uL (ref 4.0–10.5)

## 2023-05-27 LAB — LIPID PANEL
Cholesterol: 148 mg/dL (ref 0–200)
HDL: 42.1 mg/dL (ref >39.00)
LDL Cholesterol: 95 mg/dL (ref 0–99)
NonHDL: 106.24
Total CHOL/HDL Ratio: 4
Triglycerides: 58 mg/dL (ref 0.0–149.0)
VLDL: 11.6 mg/dL (ref 0.0–40.0)

## 2023-05-27 LAB — MICROALBUMIN / CREATININE URINE RATIO
Creatinine,U: 250 mg/dL
Microalb Creat Ratio: 4 mg/g (ref 0.0–30.0)
Microalb, Ur: 1 mg/dL (ref 0.0–1.9)

## 2023-05-27 LAB — HEMOGLOBIN A1C: Hgb A1c MFr Bld: 6.8 % — ABNORMAL HIGH (ref 4.6–6.5)

## 2023-05-27 LAB — URIC ACID: Uric Acid, Serum: 4.8 mg/dL (ref 2.4–7.0)

## 2023-05-27 NOTE — Progress Notes (Signed)
   Subjective:   Patient ID: Victoria Hansen, female    DOB: 28-Feb-1965, 58 y.o.   MRN: [id number]  HPI The patient is here for physical.  PMH, [facility], social history reviewed and updated  Review of Systems  Constitutional: Negative.   HENT: Negative.    Eyes: Negative.   Respiratory:  Negative for cough, chest tightness and shortness of breath.   Cardiovascular:  Negative for chest pain, palpitations and leg swelling.  Gastrointestinal:  Negative for abdominal distention, abdominal pain, constipation, diarrhea, nausea and vomiting.  Musculoskeletal: Negative.   Skin: Negative.   Neurological: Negative.   Psychiatric/Behavioral: Negative.      Objective:  Physical Exam Constitutional:      Appearance: She is well-developed. She is obese.  HENT:     Head: Normocephalic and atraumatic.  Cardiovascular:     Rate and Rhythm: Normal rate and regular rhythm.  Pulmonary:     Effort: Pulmonary effort is normal. No respiratory distress.     Breath sounds: Normal breath sounds. No wheezing or rales.  Abdominal:     General: Bowel sounds are normal. There is no distension.     Palpations: Abdomen is soft.     Tenderness: There is no abdominal tenderness. There is no rebound.  Musculoskeletal:     Cervical back: Normal range of motion.  Skin:    General: Skin is warm and dry.     Comments: Foot exam done  Neurological:     Mental Status: She is alert and oriented to person, place, and time.     Coordination: Coordination normal.     Vitals:   05/27/23 1036 05/27/23 1044 05/27/23 1100  BP: (!) 140/100 (!) 140/100 132/82  Pulse: 96    Temp: 98.2 F (36.8 C)    TempSrc: Oral    SpO2: 97%    Weight: 259 lb (117.5 kg)    Height: 5\' 1"  (1.549 m)      Assessment & Plan:  PRevnar 20 given at visit

## 2023-05-27 NOTE — Assessment & Plan Note (Signed)
 Checking HGA1c, lipid panel UACR and CMP. Adjust metformin  as needed. On aCE-I and statin. Reminded about overdue eye exam.

## 2023-05-27 NOTE — Assessment & Plan Note (Signed)
Checking lipid panel and adjust lipitor as needed.  

## 2023-05-27 NOTE — Assessment & Plan Note (Signed)
 Checking uric acid and adjust allopurinol 300 mg daily as needed.

## 2023-05-27 NOTE — Assessment & Plan Note (Signed)
 Flu shot yearly. Pneumonia given 20. Shingrix  complete. Tetanus up to date. Colonoscopy up to date. Mammogram up to date, pap smear up to date. Counseled about sun safety and mole surveillance. Counseled about the dangers of distracted driving. Given 10 year screening recommendations.

## 2023-05-27 NOTE — Assessment & Plan Note (Signed)
 BP at goal on amlodipine  and lisinopril  and checking CMP adjust as needed.

## 2023-05-27 NOTE — Assessment & Plan Note (Signed)
 Taking metformin  and counseled.

## 2023-05-30 ENCOUNTER — Encounter: Payer: Self-pay | Admitting: Internal Medicine

## 2023-05-31 LAB — VITAMIN D 25 HYDROXY (VIT D DEFICIENCY, FRACTURES): VITD: 14.66 ng/mL — ABNORMAL LOW (ref 30.00–100.00)

## 2023-06-01 ENCOUNTER — Other Ambulatory Visit: Payer: Self-pay

## 2023-06-01 DIAGNOSIS — Z8739 Personal history of other diseases of the musculoskeletal system and connective tissue: Secondary | ICD-10-CM

## 2023-06-01 MED ORDER — ALLOPURINOL 300 MG PO TABS: 300.0000 mg | ORAL_TABLET | Freq: Every day | ORAL | 1 refills | Status: DC

## 2023-06-03 ENCOUNTER — Ambulatory Visit: Payer: Self-pay | Admitting: Internal Medicine

## 2023-06-03 MED ORDER — VITAMIN D (ERGOCALCIFEROL) 1.25 MG (50000 UNIT) PO CAPS
50000.0000 [IU] | ORAL_CAPSULE | ORAL | 0 refills | Status: AC
Start: 2023-06-03 — End: ?

## 2023-06-15 ENCOUNTER — Other Ambulatory Visit: Payer: Self-pay | Admitting: Internal Medicine

## 2023-06-15 DIAGNOSIS — E118 Type 2 diabetes mellitus with unspecified complications: Secondary | ICD-10-CM

## 2023-06-15 DIAGNOSIS — E785 Hyperlipidemia, unspecified: Secondary | ICD-10-CM

## 2023-06-27 ENCOUNTER — Other Ambulatory Visit: Payer: Self-pay | Admitting: Internal Medicine

## 2023-06-27 ENCOUNTER — Encounter: Payer: Self-pay | Admitting: Internal Medicine

## 2023-06-27 DIAGNOSIS — I10 Essential (primary) hypertension: Secondary | ICD-10-CM

## 2023-06-28 ENCOUNTER — Other Ambulatory Visit: Payer: Self-pay | Admitting: Internal Medicine

## 2023-06-28 DIAGNOSIS — I10 Essential (primary) hypertension: Secondary | ICD-10-CM

## 2023-06-28 NOTE — Telephone Encounter (Signed)
 Copied from CRM [id number]. Topic: Clinical - Medication Refill >> Jun 28, 2023  3:46 PM Cruzita Dopp I wrote: Medication: amLODipine  (NORVASC ) 5 MG tablet  Has the patient contacted their pharmacy? Yes, pharmacy stated dct has to approve medication  (Agent: If no, request that the patient contact the pharmacy for the refill. If patient does not wish to contact the pharmacy document the reason why and proceed with request.) (Agent: If yes, when and what did the pharmacy advise?)  This is the patient's preferred pharmacy:  [id number] [name], [place] - [address] AT [facility] OF [facility] & [address] [place] [address] [place] [address] Phone: [phone] Fax: [phone]  Is this the correct pharmacy for this prescription? Yes If no, delete pharmacy and type the correct one.   Has the prescription been filled recently? No  Is the patient out of the medication? Yes  Has the patient been seen for an appointment in the last year OR does the patient have an upcoming appointment? Yes  Can we respond through MyChart? Yes  Agent: Please be advised that Rx refills may take up to 3 business days. We ask that you follow-up with your pharmacy.

## 2023-06-28 NOTE — Telephone Encounter (Unsigned)
 Copied from CRM [id number]. Topic: Clinical - Medication Refill >> Jun 28, 2023  2:41 PM Victoria Hansen I wrote: Medication: amLODipine  (NORVASC ) 5 MG tablet  Has the patient contacted their pharmacy? Yes, pharmacy stated dct has to approve medication  (Agent: If no, request that the patient contact the pharmacy for the refill. If patient does not wish to contact the pharmacy document the reason why and proceed with request.) (Agent: If yes, when and what did the pharmacy advise?)  This is the patient's preferred pharmacy:  [facility][id number] [name], [place] - [address] AT [facility] & [address] [place] [address] Phone: [phone] Fax: [phone]  Is this the correct pharmacy for this prescription? Yes If no, delete pharmacy and type the correct one.   Has the prescription been filled recently? No  Is the patient out of the medication? Yes  Has the patient been seen for an appointment in the last year OR does the patient have an upcoming appointment? Yes  Can we respond through MyChart? Yes  Agent: Please be advised that Rx refills may take up to 3 business days. We ask that you follow-up with your pharmacy.

## 2023-06-29 ENCOUNTER — Other Ambulatory Visit: Payer: Self-pay

## 2023-06-29 NOTE — Telephone Encounter (Signed)
 Done

## 2023-07-11 ENCOUNTER — Other Ambulatory Visit: Payer: Self-pay | Admitting: Internal Medicine

## 2023-07-11 DIAGNOSIS — I10 Essential (primary) hypertension: Secondary | ICD-10-CM

## 2023-08-23 ENCOUNTER — Other Ambulatory Visit: Payer: Self-pay | Admitting: Internal Medicine

## 2023-08-23 DIAGNOSIS — E118 Type 2 diabetes mellitus with unspecified complications: Secondary | ICD-10-CM

## 2023-11-15 ENCOUNTER — Other Ambulatory Visit: Payer: Self-pay | Admitting: Internal Medicine

## 2023-11-15 DIAGNOSIS — I10 Essential (primary) hypertension: Secondary | ICD-10-CM

## 2023-11-17 ENCOUNTER — Other Ambulatory Visit: Payer: Self-pay | Admitting: Internal Medicine

## 2023-11-17 DIAGNOSIS — Z8739 Personal history of other diseases of the musculoskeletal system and connective tissue: Secondary | ICD-10-CM

## 2023-11-30 ENCOUNTER — Other Ambulatory Visit: Payer: Self-pay | Admitting: Internal Medicine

## 2023-11-30 DIAGNOSIS — E785 Hyperlipidemia, unspecified: Secondary | ICD-10-CM

## 2023-11-30 DIAGNOSIS — E118 Type 2 diabetes mellitus with unspecified complications: Secondary | ICD-10-CM

## 2024-02-13 ENCOUNTER — Telehealth: Payer: Self-pay

## 2024-02-13 ENCOUNTER — Ambulatory Visit: Admitting: Internal Medicine

## 2024-02-13 NOTE — Telephone Encounter (Signed)
 Called and left a voicemail for Pt to give the office a callback to reschedule her appointment.

## 2024-02-15 ENCOUNTER — Ambulatory Visit: Admitting: Internal Medicine

## 2024-02-15 ENCOUNTER — Encounter: Payer: Self-pay | Admitting: Internal Medicine

## 2024-02-15 VITALS — BP 138/84 | HR 55 | Temp 98.1°F | Ht 61.0 in | Wt 246.0 lb

## 2024-02-15 DIAGNOSIS — J069 Acute upper respiratory infection, unspecified: Secondary | ICD-10-CM

## 2024-02-15 MED ORDER — HYDROCOD POLI-CHLORPHE POLI ER 10-8 MG/5ML PO SUER: 5.0000 mL | Freq: Two times a day (BID) | ORAL | 0 refills | Status: AC | PRN

## 2024-02-15 NOTE — Patient Instructions (Addendum)
" ° ° ° ° ° ° ° °  Medications changes include :   tussionex for cough     Return if symptoms worsen or fail to improve.  "

## 2024-02-15 NOTE — Progress Notes (Signed)
 "   Subjective:    Patient ID: Victoria Hansen, female    DOB: December 14, 1965, 59 y.o.   MRN: [id number]      HPI Victoria Hansen is here for  Chief Complaint  Patient presents with   Cough    Cough started last Thursday (was on a cruise ship)     Discussed the use of AI scribe software for clinical note transcription with the patient, who gave verbal consent to proceed.  History of Present Illness Victoria Hansen is a 59 year old who presents with persistent cough and nasal congestion following a recent cruise.  Her symptoms began on Thursday, one week ago, during the last two days of a seven-day cruise, with a severe and persistent cough followed by mucus accumulation in her throat and nose. She began taking over-the-counter daytime cold medication, such as DayQuil, on Saturday after disembarking from the ship. The cough has slightly improved but persists.  She has not experienced any fever since checking her temperature upon returning home, although she felt slightly feverish while on the ship. Her nasal congestion is described as having a yellowish or cloudy discharge, with an occasional burning sensation when blowing her nose. No ear pain, sinus pain, headaches, dizziness, lightheadedness, sore throat, shortness of breath, wheezing, stomach symptoms, or body aches.  The cough occasionally disrupts her sleep, but she has been able to sleep better over the last couple of days. She also notes coughing up mucus with a reddish tinge.  Her current medications include the daytime cold medication and Flonase , which she uses regularly. She has not taken any other unusual medications.      Medications and allergies reviewed with patient and updated if appropriate.  Medications Ordered Prior to Encounter[1]  Review of Systems  Constitutional:  Positive for chills. Negative for fever.  HENT:  Positive for congestion and postnasal drip. Negative for ear pain, sinus pressure, sinus pain and  sore throat.   Respiratory:  Positive for cough (yellow mucus). Negative for shortness of breath and wheezing.   Gastrointestinal:  Negative for diarrhea and nausea.  Musculoskeletal:  Negative for myalgias.  Neurological:  Negative for dizziness, light-headedness and headaches.       Objective:   Vitals:   02/15/24 1302  BP: 138/84  Pulse: (!) 55  Temp: 98.1 F (36.7 C)  SpO2: 97%   BP Readings from Last 3 Encounters:  02/15/24 138/84  05/27/23 132/82  12/01/22 118/80   Wt Readings from Last 3 Encounters:  02/15/24 246 lb (111.6 kg)  05/27/23 259 lb (117.5 kg)  04/06/23 262 lb (118.8 kg)   Body mass index is 46.48 kg/m.    Physical Exam Constitutional:      General: She is not in acute distress.    Appearance: Normal appearance. She is not ill-appearing.  HENT:     Head: Normocephalic and atraumatic.     Right Ear: Tympanic membrane, ear canal and external ear normal.     Left Ear: Tympanic membrane, ear canal and external ear normal.     Mouth/Throat:     Mouth: Mucous membranes are moist.     Pharynx: No oropharyngeal exudate or posterior oropharyngeal erythema.  Eyes:     Conjunctiva/sclera: Conjunctivae normal.  Cardiovascular:     Rate and Rhythm: Normal rate and regular rhythm.  Pulmonary:     Effort: Pulmonary effort is normal. No respiratory distress.     Breath sounds: Normal breath sounds. No wheezing or rales.  Musculoskeletal:  Cervical back: Neck supple. No tenderness.  Lymphadenopathy:     Cervical: No cervical adenopathy.  Skin:    General: Skin is warm and dry.  Neurological:     Mental Status: She is alert.        COVID test here negative    Assessment & Plan:    See Problem List for Assessment and Plan of chronic medical problems.    Assessment and Plan Assessment & Plan Acute upper respiratory infection Likely viral etiology with persistent cough and nasal congestion. COVID-19 ruled out. Antibiotics not indicated. -  Prescribed hydrocodone cough syrup for nighttime use. - Advised continued use of daytime cold medication as needed. - Encouraged increased fluid intake. - Recommended saline sprays for nasal dryness. - Advised rest and symptomatic treatment with Tylenol, ibuprofen, Flonase , or Mucinex as needed. - Instructed to contact if symptoms worsen or do not improve.        [1]  Current Outpatient Medications on File Prior to Visit  Medication Sig Dispense Refill   allopurinol  (ZYLOPRIM ) 300 MG tablet TAKE 1 TABLET(300 MG) BY MOUTH DAILY 90 tablet 1   amLODipine  (NORVASC ) 5 MG tablet TAKE 1 TABLET(5 MG) BY MOUTH DAILY 90 tablet 1   aspirin EC 81 MG tablet Take 81 mg by mouth daily.     atorvastatin  (LIPITOR) 40 MG tablet TAKE 1 TABLET(40 MG) BY MOUTH DAILY 90 tablet 1   Blood Glucose Monitoring Suppl (BLOOD GLUCOSE MONITOR SYSTEM) w/Device KIT 1 Units by Does not apply route daily. 1 each 0   fluticasone  (FLONASE ) 50 MCG/ACT nasal spray Place 2 sprays into both nostrils daily. 48 g 3   glucose blood test strip Use to check blood glucose 1-2 times daily. Dispense type that matches machine. E11. 9 100 each 12   indomethacin  (INDOCIN ) 50 MG capsule 50 mg 3 times daily; initiate within 24 to 48 hours of flare onset preferably; discontinue 2 to 3 days after resolution of clinical signs; usual duration: 5 to 7 days 30 capsule 1   lisinopril  (ZESTRIL ) 20 MG tablet TAKE 1 TABLET(20 MG) BY MOUTH DAILY 90 tablet 3   meloxicam  (MOBIC ) 15 MG tablet Take 1 tablet (15 mg total) by mouth daily. 30 tablet 0   metFORMIN  (GLUCOPHAGE -XR) 750 MG 24 hr tablet TAKE 1 TABLET(750 MG) BY MOUTH DAILY WITH BREAKFAST 90 tablet 1   predniSONE  (DELTASONE ) 20 MG tablet Take 2 tablets (40 mg total) by mouth daily with breakfast. 10 tablet 0   Vitamin D , Ergocalciferol , (DRISDOL ) 1.25 MG (50000 UNIT) CAPS capsule Take 1 capsule (50,000 Units total) by mouth every 7 (seven) days. 12 capsule 0   No current facility-administered  medications on file prior to visit.   "
